# Patient Record
Sex: Female | Born: 1945 | Race: Black or African American | Hispanic: No | Marital: Single | Smoking: Never smoker
Health system: Southern US, Community
[De-identification: ages and names within clinical notes are randomized; demographics above are authoritative.]

## PROBLEM LIST (undated history)

## (undated) DIAGNOSIS — M1990 Unspecified osteoarthritis, unspecified site: Secondary | ICD-10-CM

## (undated) DIAGNOSIS — K5792 Diverticulitis of intestine, part unspecified, without perforation or abscess without bleeding: Secondary | ICD-10-CM

## (undated) DIAGNOSIS — I5189 Other ill-defined heart diseases: Secondary | ICD-10-CM

## (undated) DIAGNOSIS — D126 Benign neoplasm of colon, unspecified: Secondary | ICD-10-CM

## (undated) DIAGNOSIS — R0602 Shortness of breath: Secondary | ICD-10-CM

## (undated) DIAGNOSIS — E049 Nontoxic goiter, unspecified: Secondary | ICD-10-CM

## (undated) DIAGNOSIS — K2960 Other gastritis without bleeding: Secondary | ICD-10-CM

## (undated) DIAGNOSIS — R079 Chest pain, unspecified: Secondary | ICD-10-CM

## (undated) DIAGNOSIS — M5030 Other cervical disc degeneration, unspecified cervical region: Secondary | ICD-10-CM

## (undated) DIAGNOSIS — E669 Obesity, unspecified: Secondary | ICD-10-CM

## (undated) DIAGNOSIS — K529 Noninfective gastroenteritis and colitis, unspecified: Secondary | ICD-10-CM

## (undated) DIAGNOSIS — I10 Essential (primary) hypertension: Secondary | ICD-10-CM

## (undated) DIAGNOSIS — E785 Hyperlipidemia, unspecified: Secondary | ICD-10-CM

## (undated) DIAGNOSIS — R3129 Other microscopic hematuria: Secondary | ICD-10-CM

## (undated) DIAGNOSIS — J45909 Unspecified asthma, uncomplicated: Secondary | ICD-10-CM

## (undated) DIAGNOSIS — E119 Type 2 diabetes mellitus without complications: Secondary | ICD-10-CM

## (undated) DIAGNOSIS — R002 Palpitations: Secondary | ICD-10-CM

## (undated) DIAGNOSIS — D649 Anemia, unspecified: Secondary | ICD-10-CM

## (undated) DIAGNOSIS — N2581 Secondary hyperparathyroidism of renal origin: Secondary | ICD-10-CM

## (undated) DIAGNOSIS — E213 Hyperparathyroidism, unspecified: Secondary | ICD-10-CM

## (undated) DIAGNOSIS — E042 Nontoxic multinodular goiter: Secondary | ICD-10-CM

## (undated) DIAGNOSIS — N1831 Chronic kidney disease, stage 3a: Secondary | ICD-10-CM

## (undated) DIAGNOSIS — N281 Cyst of kidney, acquired: Secondary | ICD-10-CM

## (undated) DIAGNOSIS — K219 Gastro-esophageal reflux disease without esophagitis: Secondary | ICD-10-CM

## (undated) HISTORY — PX: APPENDECTOMY: SHX54

## (undated) HISTORY — PX: TUBAL LIGATION: SHX77

## (undated) HISTORY — PX: JOINT REPLACEMENT: SHX530

## (undated) HISTORY — PX: ANTERIOR CERVICAL DECOMP/DISCECTOMY FUSION: SHX1161

## (undated) HISTORY — PX: BILATERAL SALPINGOOPHORECTOMY: SHX1223

## (undated) HISTORY — PX: VAGINAL HYSTERECTOMY: SHX2639

## (undated) HISTORY — PX: SHOULDER ARTHROSCOPY W/ ROTATOR CUFF REPAIR: SHX2400

---

## 2014-08-30 ENCOUNTER — Ambulatory Visit: Payer: Self-pay

## 2014-08-30 LAB — URINALYSIS, COMPLETE
BILIRUBIN, UR: NEGATIVE
GLUCOSE, UR: NEGATIVE
Ketone: NEGATIVE
Nitrite: NEGATIVE
PH: 5.5 (ref 5.0–8.0)
Protein: NEGATIVE
Specific Gravity: 1.01 (ref 1.000–1.030)
Squamous Epithelial: NONE SEEN

## 2014-09-01 LAB — URINE CULTURE

## 2015-03-06 HISTORY — PX: TOTAL HIP ARTHROPLASTY: SHX124

## 2015-04-10 HISTORY — PX: COLONOSCOPY: SHX174

## 2017-01-12 HISTORY — PX: TOTAL HIP ARTHROPLASTY: SHX124

## 2018-10-24 ENCOUNTER — Ambulatory Visit
Admission: EM | Admit: 2018-10-24 | Discharge: 2018-10-24 | Disposition: A | Discharge status: Home / Self Care | Payer: Medicare HMO | Attending: Family Medicine | Admitting: Family Medicine

## 2018-10-24 ENCOUNTER — Encounter: Payer: Self-pay | Admitting: Emergency Medicine

## 2018-10-24 ENCOUNTER — Other Ambulatory Visit: Payer: Self-pay

## 2018-10-24 DIAGNOSIS — E785 Hyperlipidemia, unspecified: Secondary | ICD-10-CM | POA: Insufficient documentation

## 2018-10-24 DIAGNOSIS — R1110 Vomiting, unspecified: Secondary | ICD-10-CM | POA: Insufficient documentation

## 2018-10-24 DIAGNOSIS — M791 Myalgia, unspecified site: Secondary | ICD-10-CM | POA: Diagnosis not present

## 2018-10-24 DIAGNOSIS — M25511 Pain in right shoulder: Secondary | ICD-10-CM | POA: Diagnosis not present

## 2018-10-24 DIAGNOSIS — E119 Type 2 diabetes mellitus without complications: Secondary | ICD-10-CM | POA: Insufficient documentation

## 2018-10-24 DIAGNOSIS — E669 Obesity, unspecified: Secondary | ICD-10-CM | POA: Diagnosis not present

## 2018-10-24 DIAGNOSIS — Z79899 Other long term (current) drug therapy: Secondary | ICD-10-CM | POA: Diagnosis not present

## 2018-10-24 DIAGNOSIS — Z6832 Body mass index (BMI) 32.0-32.9, adult: Secondary | ICD-10-CM | POA: Insufficient documentation

## 2018-10-24 DIAGNOSIS — I493 Ventricular premature depolarization: Secondary | ICD-10-CM | POA: Insufficient documentation

## 2018-10-24 DIAGNOSIS — R9431 Abnormal electrocardiogram [ECG] [EKG]: Secondary | ICD-10-CM | POA: Diagnosis not present

## 2018-10-24 DIAGNOSIS — I10 Essential (primary) hypertension: Secondary | ICD-10-CM | POA: Diagnosis not present

## 2018-10-24 DIAGNOSIS — M25512 Pain in left shoulder: Secondary | ICD-10-CM | POA: Insufficient documentation

## 2018-10-24 DIAGNOSIS — Z885 Allergy status to narcotic agent status: Secondary | ICD-10-CM | POA: Diagnosis not present

## 2018-10-24 DIAGNOSIS — Z7984 Long term (current) use of oral hypoglycemic drugs: Secondary | ICD-10-CM | POA: Diagnosis not present

## 2018-10-24 DIAGNOSIS — R002 Palpitations: Secondary | ICD-10-CM | POA: Insufficient documentation

## 2018-10-24 HISTORY — DX: Hyperlipidemia, unspecified: E78.5

## 2018-10-24 HISTORY — DX: Diverticulitis of intestine, part unspecified, without perforation or abscess without bleeding: K57.92

## 2018-10-24 HISTORY — DX: Essential (primary) hypertension: I10

## 2018-10-24 HISTORY — DX: Hyperparathyroidism, unspecified: E21.3

## 2018-10-24 HISTORY — DX: Type 2 diabetes mellitus without complications: E11.9

## 2018-10-24 HISTORY — DX: Obesity, unspecified: E66.9

## 2018-10-24 HISTORY — DX: Anemia, unspecified: D64.9

## 2018-10-24 HISTORY — DX: Other microscopic hematuria: R31.29

## 2018-10-24 HISTORY — DX: Other gastritis without bleeding: K29.60

## 2018-10-24 HISTORY — DX: Nontoxic goiter, unspecified: E04.9

## 2018-10-24 LAB — COMPREHENSIVE METABOLIC PANEL
ALT: 14 U/L (ref 0–44)
AST: 21 U/L (ref 15–41)
Albumin: 4.1 g/dL (ref 3.5–5.0)
Alkaline Phosphatase: 73 U/L (ref 38–126)
Anion gap: 9 (ref 5–15)
BUN: 20 mg/dL (ref 8–23)
CO2: 25 mmol/L (ref 22–32)
CREATININE: 1.23 mg/dL — AB (ref 0.44–1.00)
Calcium: 9.9 mg/dL (ref 8.9–10.3)
Chloride: 105 mmol/L (ref 98–111)
GFR calc Af Amer: 51 mL/min — ABNORMAL LOW (ref >60)
GFR calc non Af Amer: 44 mL/min — ABNORMAL LOW (ref >60)
Glucose, Bld: 106 mg/dL — ABNORMAL HIGH (ref 70–99)
POTASSIUM: 4.2 mmol/L (ref 3.5–5.1)
Sodium: 139 mmol/L (ref 135–145)
Total Bilirubin: 0.3 mg/dL (ref 0.3–1.2)
Total Protein: 7.6 g/dL (ref 6.5–8.1)

## 2018-10-24 LAB — CBC WITH DIFFERENTIAL/PLATELET
Abs Immature Granulocytes: 0.01 10*3/uL (ref 0.00–0.07)
Basophils Absolute: 0.1 10*3/uL (ref 0.0–0.1)
Basophils Relative: 1 %
Eosinophils Absolute: 0.4 10*3/uL (ref 0.0–0.5)
Eosinophils Relative: 6 %
HCT: 31 % — ABNORMAL LOW (ref 36.0–46.0)
Hemoglobin: 10 g/dL — ABNORMAL LOW (ref 12.0–15.0)
Immature Granulocytes: 0 %
LYMPHS ABS: 2 10*3/uL (ref 0.7–4.0)
Lymphocytes Relative: 30 %
MCH: 26.8 pg (ref 26.0–34.0)
MCHC: 32.3 g/dL (ref 30.0–36.0)
MCV: 83.1 fL (ref 80.0–100.0)
MONOS PCT: 8 %
Monocytes Absolute: 0.5 10*3/uL (ref 0.1–1.0)
Neutro Abs: 3.8 10*3/uL (ref 1.7–7.7)
Neutrophils Relative %: 55 %
Platelets: 335 10*3/uL (ref 150–400)
RBC: 3.73 MIL/uL — ABNORMAL LOW (ref 3.87–5.11)
RDW: 13.8 % (ref 11.5–15.5)
WBC: 6.8 10*3/uL (ref 4.0–10.5)
nRBC: 0 % (ref 0.0–0.2)

## 2018-10-24 LAB — LIPASE, BLOOD: Lipase: 25 U/L (ref 11–51)

## 2018-10-24 NOTE — ED Provider Notes (Signed)
[facility]    CSN: [id number] Arrival date & time: 10/24/18  1029     History   Chief Complaint Chief Complaint  Patient presents with  . Emesis  . Palpitations  . Muscle Pain    HPI Joyce Mckinney is a 72 y.o. female.   72 yo female with a c/o vomiting and palpitations last night associated with left and right shoulder pains. States vomiting and shoulder pains have resolved, however still feels palpitations. Denies any chest pains, shortness of breath, fevers, chills, diarrhea, abdominal pains. Also states that about 2 weeks ago she had similar symptoms that resolved on their own.    The history is provided by the patient.  Emesis  Palpitations  Associated symptoms: vomiting   Muscle Pain     Past Medical History:  Diagnosis Date  . Anemia   . Diabetes mellitus without complication ([place])   . Diverticulitis   . Goiter   . Hyperlipidemia   . Hyperparathyroidism ([place])   . Hypertension   . Microscopic hematuria   . Obesity   . Reflux gastritis     There are no active problems to display for this patient.   Past Surgical History:  Procedure Laterality Date  . JOINT REPLACEMENT      OB History   None      Home Medications    Prior to Admission medications   Medication Sig Start Date End Date Taking? Authorizing Provider  famotidine (PEPCID) 10 MG tablet Take 10 mg by mouth 2 (two) times daily.   Yes [provider]  fluticasone (FLONASE) 50 MCG/ACT nasal spray SHAKE LQ AND U 2 SPRAYS IEN BID 10/18/18  Yes [provider]  glipiZIDE (GLUCOTROL XL) 2.5 MG 24 hr tablet  10/18/18  Yes [provider]  latanoprost (XALATAN) 0.005 % ophthalmic solution  10/03/18  Yes [provider]  metFORMIN (GLUCOPHAGE-XR) 750 MG 24 hr tablet  09/01/18  Yes [provider]  olmesartan-hydrochlorothiazide (BENICAR HCT) 40-25 MG tablet  08/05/18  Yes [provider]  simvastatin (ZOCOR) 40 MG tablet  08/31/18   Yes [provider]    Family History History reviewed. No pertinent family history.  Social History Social History   Tobacco Use  . Smoking status: Never Smoker  . Smokeless tobacco: Never Used  Substance Use Topics  . Alcohol use: Yes    Comment: socially  . Drug use: Never     Allergies   Morphine and related   Review of Systems Review of Systems  Cardiovascular: Positive for palpitations.  Gastrointestinal: Positive for vomiting.     Physical Exam Triage Vital Signs ED Triage Vitals  Enc Vitals Group     BP 10/24/18 1051 (!) 151/84     Pulse Rate 10/24/18 1051 81     Resp 10/24/18 1051 18     Temp 10/24/18 1051 98.1 F (36.7 C)     Temp Source 10/24/18 1051 Oral     SpO2 10/24/18 1051 100 %     Weight 10/24/18 1044 180 lb (81.6 kg)     Height 10/24/18 1044 5\' 2"  (1.575 m)     Head Circumference --      Peak Flow --      Pain Score 10/24/18 1044 0     Pain Loc --      Pain Edu? --      Excl. in [place]? --    No data found.  Updated Vital Signs BP (!) 151/84 (  BP Location: Left Arm)   Pulse 81   Temp 98.1 F (36.7 C) (Oral)   Resp 18   Ht 5\' 2"  (1.575 m)   Wt 81.6 kg   SpO2 100%   BMI 32.92 kg/m   Visual Acuity Right Eye Distance:   Left Eye Distance:   Bilateral Distance:    Right Eye Near:   Left Eye Near:    Bilateral Near:     Physical Exam  Constitutional: She appears well-developed and well-nourished. No distress.  HENT:  Head: Normocephalic and atraumatic.  Mouth/Throat: Uvula is midline, oropharynx is clear and moist and mucous membranes are normal. No oropharyngeal exudate.  Neck: Normal range of motion. Neck supple. No thyromegaly present.  Cardiovascular: Normal rate, regular rhythm, normal heart sounds and intact distal pulses.  Pulmonary/Chest: Effort normal and breath sounds normal. No stridor. No respiratory distress. She has no wheezes. She has no rales. She exhibits tenderness (upper chest bilaterally; mild).    Lymphadenopathy:    She has no cervical adenopathy.  Skin: She is not diaphoretic.  Nursing note and vitals reviewed.    UC Treatments / Results  Labs (all labs ordered are listed, but only abnormal results are displayed) Labs Reviewed  CBC WITH DIFFERENTIAL/PLATELET - Abnormal; Notable for the following components:      Result Value   RBC 3.73 (*)    Hemoglobin 10.0 (*)    HCT 31.0 (*)    All other components within normal limits  COMPREHENSIVE METABOLIC PANEL - Abnormal; Notable for the following components:   Glucose, Bld 106 (*)    Creatinine, Ser 1.23 (*)    GFR calc non Af Amer 44 (*)    GFR calc Af Amer 51 (*)    All other components within normal limits  LIPASE, BLOOD    EKG None  Radiology No results found.  Procedures ED EKG Date/Time: 10/24/2018 1:29 PM Performed by: Norval Gable, MD Authorized by: Norval Gable, MD   ECG reviewed by ED Physician in the absence of a cardiologist: yes   Previous ECG:    Previous ECG:  Unavailable Interpretation:    Interpretation: abnormal   Rate:    ECG rate:  81   ECG rate assessment: normal   Rhythm:    Rhythm: sinus rhythm   Ectopy:    Ectopy: PVCs     PVCs:  Infrequent QRS:    QRS axis:  Normal   QRS intervals:  Normal Conduction:    Conduction: normal   ST segments:    ST segments:  Normal T waves:    T waves: normal     (including critical care time)  Medications Ordered in UC Medications - No data to display  Initial Impression / Assessment and Plan / UC Course  I have reviewed the triage vital signs and the nursing notes.  Pertinent labs & imaging results that were available during my care of the patient were reviewed by me and considered in my medical decision making (see chart for details).      Final Clinical Impressions(s) / UC Diagnoses   Final diagnoses:  Palpitations  Premature ventricular contractions (PVCs) (VPCs)     Discharge Instructions     Recommend follow up with  Primary Care provider for referral to cardiology    ED Prescriptions    None     1. Labs/ekg results and diagnosis reviewed with patient 2. Follow up with PCP as above 3. Go to ED if symptoms recur or  worsen 4. Follow-up prn    Controlled Substance Prescriptions [place] Controlled Substance Registry consulted? Not Applicable   Norval Gable, MD 10/24/18 [phone]

## 2018-10-24 NOTE — ED Triage Notes (Signed)
Patient c/o vomiting x 2 last night and heart palpitations that started last night. She also reported left and right shoulder pain last night but that has subsided.

## 2018-10-24 NOTE — Discharge Instructions (Signed)
Recommend follow up with Primary Care provider for referral to cardiology

## 2019-12-07 ENCOUNTER — Other Ambulatory Visit: Payer: Self-pay | Admitting: Acute Care

## 2019-12-07 DIAGNOSIS — M545 Low back pain, unspecified: Secondary | ICD-10-CM

## 2019-12-07 DIAGNOSIS — R32 Unspecified urinary incontinence: Secondary | ICD-10-CM

## 2019-12-08 ENCOUNTER — Ambulatory Visit
Admission: RE | Admit: 2019-12-08 | Discharge: 2019-12-08 | Disposition: A | Discharge status: Home / Self Care | Payer: Medicare HMO | Source: Ambulatory Visit | Attending: Acute Care | Admitting: Acute Care

## 2019-12-08 ENCOUNTER — Other Ambulatory Visit: Payer: Self-pay

## 2019-12-08 DIAGNOSIS — R32 Unspecified urinary incontinence: Secondary | ICD-10-CM | POA: Insufficient documentation

## 2019-12-08 DIAGNOSIS — M545 Low back pain, unspecified: Secondary | ICD-10-CM

## 2020-01-04 HISTORY — PX: LUMBAR FUSION: SHX111

## 2020-01-17 ENCOUNTER — Other Ambulatory Visit: Payer: Self-pay | Admitting: Student

## 2020-01-17 DIAGNOSIS — M79604 Pain in right leg: Secondary | ICD-10-CM

## 2020-01-17 DIAGNOSIS — Z981 Arthrodesis status: Secondary | ICD-10-CM

## 2020-01-24 ENCOUNTER — Ambulatory Visit
Admission: RE | Admit: 2020-01-24 | Discharge: 2020-01-24 | Disposition: A | Discharge status: Home / Self Care | Payer: Medicare HMO | Source: Ambulatory Visit | Attending: Student | Admitting: Student

## 2020-01-24 ENCOUNTER — Other Ambulatory Visit: Payer: Self-pay

## 2020-01-24 DIAGNOSIS — M79604 Pain in right leg: Secondary | ICD-10-CM | POA: Diagnosis present

## 2020-01-24 DIAGNOSIS — Z981 Arthrodesis status: Secondary | ICD-10-CM | POA: Insufficient documentation

## 2021-01-05 ENCOUNTER — Other Ambulatory Visit: Payer: Self-pay

## 2021-01-05 ENCOUNTER — Encounter: Payer: Self-pay | Admitting: Emergency Medicine

## 2021-01-05 ENCOUNTER — Emergency Department
Admission: EM | Admit: 2021-01-05 | Discharge: 2021-01-05 | Disposition: A | Discharge status: Home / Self Care | Payer: Medicare HMO | Attending: Emergency Medicine | Admitting: Emergency Medicine

## 2021-01-05 DIAGNOSIS — H5713 Ocular pain, bilateral: Secondary | ICD-10-CM | POA: Insufficient documentation

## 2021-01-05 DIAGNOSIS — I10 Essential (primary) hypertension: Secondary | ICD-10-CM | POA: Diagnosis not present

## 2021-01-05 DIAGNOSIS — Z7984 Long term (current) use of oral hypoglycemic drugs: Secondary | ICD-10-CM | POA: Insufficient documentation

## 2021-01-05 DIAGNOSIS — E119 Type 2 diabetes mellitus without complications: Secondary | ICD-10-CM | POA: Diagnosis not present

## 2021-01-05 DIAGNOSIS — Z96659 Presence of unspecified artificial knee joint: Secondary | ICD-10-CM | POA: Diagnosis not present

## 2021-01-05 DIAGNOSIS — H209 Unspecified iridocyclitis: Secondary | ICD-10-CM | POA: Diagnosis not present

## 2021-01-05 DIAGNOSIS — Z79899 Other long term (current) drug therapy: Secondary | ICD-10-CM | POA: Diagnosis not present

## 2021-01-05 DIAGNOSIS — H5710 Ocular pain, unspecified eye: Secondary | ICD-10-CM

## 2021-01-05 MED ORDER — PREDNISOLONE ACETATE 1 % OP SUSP: 1.0000 [drp] | Freq: Four times a day (QID) | OPHTHALMIC | 0 refills | Status: DC

## 2021-01-05 MED ORDER — TETRACAINE HCL 0.5 % OP SOLN
2.0000 [drp] | Freq: Once | OPHTHALMIC | Status: AC
  Administered 2021-01-05: 2 [drp] via OPHTHALMIC
  Filled 2021-01-05: qty 4

## 2021-01-05 MED ORDER — FLUORESCEIN SODIUM 1 MG OP STRP
1.0000 | ORAL_STRIP | Freq: Once | OPHTHALMIC | Status: AC
  Administered 2021-01-05: 1 via OPHTHALMIC
  Filled 2021-01-05: qty 1

## 2021-01-05 MED ORDER — TOBRAMYCIN 0.3 % OP SOLN: 2.0000 [drp] | OPHTHALMIC | 0 refills | Status: DC

## 2021-01-05 NOTE — ED Notes (Signed)
Pt with c/o bilateral eye pain beginning last night, pt states there was no cause that she remembers. Pt with redness to bilateral sclera, no drainage noted.

## 2021-01-05 NOTE — ED Provider Notes (Signed)
[facility] Emergency Department Provider Note  ____________________________________________   Event Date/Time   First MD Initiated Contact with Patient 01/05/21 1236     (approximate)  I have reviewed the triage vital signs and the nursing notes.   HISTORY  Chief Complaint Eye Pain    HPI Joyce Mckinney is a 75 y.o. female presents emergency department complaining of bilateral eye pain that started last night.  No known injury.  Patient has history of glaucoma but is been taking her drops every day as instructed.  States she did run out of them for a month but restarted her medication over 2 weeks ago.  No change in her vision.  States the light hurts her eyes.  No vomiting.  No headache.    Past Medical History:  Diagnosis Date  . Anemia   . Diabetes mellitus without complication ([place])   . Diverticulitis   . Goiter   . Hyperlipidemia   . Hyperparathyroidism ([place])   . Hypertension   . Microscopic hematuria   . Obesity   . Reflux gastritis     There are no problems to display for this patient.   Past Surgical History:  Procedure Laterality Date  . JOINT REPLACEMENT      Prior to Admission medications   Medication Sig Start Date End Date Taking? Authorizing Provider  prednisoLONE acetate (PRED FORTE) 1 % ophthalmic suspension Place 1 drop into both eyes 4 (four) times daily. 01/05/21  Yes Banyan Goodchild, Linden Dolin, PA-C  tobramycin (TOBREX) 0.3 % ophthalmic solution Place 2 drops into both eyes every 4 (four) hours. 01/05/21  Yes Janoah Menna, Linden Dolin, PA-C  famotidine (PEPCID) 10 MG tablet Take 10 mg by mouth 2 (two) times daily.    [provider]  fluticasone (FLONASE) 50 MCG/ACT nasal spray SHAKE LQ AND U 2 SPRAYS IEN BID 10/18/18   [provider]  glipiZIDE (GLUCOTROL XL) 2.5 MG 24 hr tablet  10/18/18   [provider]  latanoprost (XALATAN) 0.005 % ophthalmic solution  10/03/18   [provider]  metFORMIN  (GLUCOPHAGE-XR) 750 MG 24 hr tablet  09/01/18   [provider]  olmesartan-hydrochlorothiazide (BENICAR HCT) 40-25 MG tablet  08/05/18   [provider]  simvastatin (ZOCOR) 40 MG tablet  08/31/18   [provider]    Allergies Morphine and related  History reviewed. No pertinent family history.  Social History Social History   Tobacco Use  . Smoking status: Never Smoker  . Smokeless tobacco: Never Used  Vaping Use  . Vaping Use: Never used  Substance Use Topics  . Alcohol use: Yes    Comment: socially  . Drug use: Never    Review of Systems  Constitutional: No fever/chills Eyes: No visual changes.  Positive eye pain bilaterally ENT: No sore throat. Respiratory: Denies cough Cardiovascular: Denies chest pain Gastrointestinal: Denies abdominal pain Genitourinary: Negative for dysuria. Musculoskeletal: Negative for back pain. Skin: Negative for rash. Psychiatric: no mood changes,     ____________________________________________   PHYSICAL EXAM:  VITAL SIGNS: ED Triage Vitals  Enc Vitals Group     BP 01/05/21 1120 (!) 168/77     Pulse Rate 01/05/21 1120 92     Resp 01/05/21 1120 20     Temp 01/05/21 1120 98.6 F (37 C)     Temp Source 01/05/21 1120 Oral     SpO2 01/05/21 1120 96 %     Weight 01/05/21 1121 155 lb (70.3 kg)  Height 01/05/21 1121 5' 2.5" (1.588 m)     Head Circumference --      Peak Flow --      Pain Score 01/05/21 1121 0     Pain Loc --      Pain Edu? --      Excl. in [place]? --     Constitutional: Alert and oriented. Well appearing and in no acute distress. Eyes: Conjunctivae are slightly injected bilaterally, Tono-Pen used for pressures, the right averages at 14 and the left averages at 15 no foreign body noted, PERRL  head: Atraumatic. Nose: No congestion/rhinnorhea. Mouth/Throat: Mucous membranes are moist.  Neck:  supple no lymphadenopathy noted Cardiovascular: Normal rate, regular rhythm.  Respiratory:  Normal respiratory effort.  No retractions, GU: deferred Musculoskeletal: FROM all extremities, warm and well perfused Neurologic:  Normal speech and language.  Skin:  Skin is warm, dry and intact. No rash noted. Psychiatric: Mood and affect are normal. Speech and behavior are normal.  ____________________________________________   LABS (all labs ordered are listed, but only abnormal results are displayed)  Labs Reviewed - No data to display ____________________________________________   ____________________________________________  RADIOLOGY    ____________________________________________   PROCEDURES  Procedure(s) performed: No  Procedures    ____________________________________________   INITIAL IMPRESSION / ASSESSMENT AND PLAN / ED COURSE  Pertinent labs & imaging results that were available during my care of the patient were reviewed by me and considered in my medical decision making (see chart for details).   Patient 75 year old female presents with history of glaucoma and complaining of bilateral eye pain and sensitivity to light.  See HPI.  Physical exam shows the patient to appear stable.  Both eyes are injected.  Tono-Pen used for pressures which average to 14 on the right and 15 on the left.  Patient states this is her normal glaucoma reading.  She is to follow-up with her eye doctor tomorrow.  I feel this may be more of a iritis.  She was started on tobramycin ophthalmic drops and prednisolone ophthalmic drops.  Is to return emergency department worsening.  Is discharged in stable condition care of her daughter     Joyce Mckinney was evaluated in Emergency Department on 01/05/2021 for the symptoms described in the history of present illness. She was evaluated in the context of the global COVID-19 pandemic, which necessitated consideration that the patient might be at risk for infection with the SARS-CoV-2 virus that causes COVID-19. Institutional  protocols and algorithms that pertain to the evaluation of patients at risk for COVID-19 are in a state of rapid change based on information released by regulatory bodies including the CDC and federal and state organizations. These policies and algorithms were followed during the patient's care in the ED.    As part of my medical decision making, I reviewed the following data within the [place] notes reviewed and incorporated, Old chart reviewed, Notes from prior ED visits and [place] Controlled Substance Database  ____________________________________________   FINAL CLINICAL IMPRESSION(S) / ED DIAGNOSES  Final diagnoses:  Pain in eye, unspecified laterality  Iritis of both eyes      NEW MEDICATIONS STARTED DURING THIS VISIT:  New Prescriptions   PREDNISOLONE ACETATE (PRED FORTE) 1 % OPHTHALMIC SUSPENSION    Place 1 drop into both eyes 4 (four) times daily.   TOBRAMYCIN (TOBREX) 0.3 % OPHTHALMIC SOLUTION    Place 2 drops into both eyes every 4 (four) hours.     Note:  This document was prepared using Dragon voice recognition software and may include unintentional dictation errors.    Versie Starks, PA-C 01/05/21 1400    Delman Kitten, MD 01/05/21 [phone]

## 2021-01-05 NOTE — ED Triage Notes (Signed)
Pt to ED via POV with c/o R eye pain, pt states woke up last night with 2 red eyes, states woke up this morning with pain to R eye, also c/o light sensitivity at this time. Pt states pain relief with warm compresses.

## 2021-01-05 NOTE — ED Notes (Signed)
Pt acknowledged receipt of DC instructions and verbalized understanding of importance of F/U and new rx's. Signature pad not working.

## 2021-01-05 NOTE — Discharge Instructions (Addendum)
Follow-up with your regular eye doctor.  Please call for an appointment Return to the emergency department for worsening You may take over-the-counter pain medication as needed

## 2021-06-13 ENCOUNTER — Emergency Department: Payer: Medicare HMO

## 2021-06-13 ENCOUNTER — Emergency Department
Admission: EM | Admit: 2021-06-13 | Discharge: 2021-06-13 | Disposition: A | Discharge status: Home / Self Care | Payer: Medicare HMO | Attending: Emergency Medicine | Admitting: Emergency Medicine

## 2021-06-13 ENCOUNTER — Encounter: Payer: Self-pay | Admitting: Emergency Medicine

## 2021-06-13 ENCOUNTER — Other Ambulatory Visit: Payer: Self-pay

## 2021-06-13 DIAGNOSIS — M542 Cervicalgia: Secondary | ICD-10-CM | POA: Diagnosis not present

## 2021-06-13 DIAGNOSIS — S20219A Contusion of unspecified front wall of thorax, initial encounter: Secondary | ICD-10-CM

## 2021-06-13 DIAGNOSIS — S8002XA Contusion of left knee, initial encounter: Secondary | ICD-10-CM | POA: Insufficient documentation

## 2021-06-13 DIAGNOSIS — S20212A Contusion of left front wall of thorax, initial encounter: Secondary | ICD-10-CM | POA: Insufficient documentation

## 2021-06-13 DIAGNOSIS — I10 Essential (primary) hypertension: Secondary | ICD-10-CM | POA: Insufficient documentation

## 2021-06-13 DIAGNOSIS — Z9660 Presence of unspecified orthopedic joint implant: Secondary | ICD-10-CM | POA: Insufficient documentation

## 2021-06-13 DIAGNOSIS — S301XXA Contusion of abdominal wall, initial encounter: Secondary | ICD-10-CM | POA: Insufficient documentation

## 2021-06-13 DIAGNOSIS — Z7984 Long term (current) use of oral hypoglycemic drugs: Secondary | ICD-10-CM | POA: Insufficient documentation

## 2021-06-13 DIAGNOSIS — R109 Unspecified abdominal pain: Secondary | ICD-10-CM

## 2021-06-13 DIAGNOSIS — Y92410 Unspecified street and highway as the place of occurrence of the external cause: Secondary | ICD-10-CM | POA: Insufficient documentation

## 2021-06-13 DIAGNOSIS — M25512 Pain in left shoulder: Secondary | ICD-10-CM | POA: Diagnosis not present

## 2021-06-13 DIAGNOSIS — S299XXA Unspecified injury of thorax, initial encounter: Secondary | ICD-10-CM

## 2021-06-13 DIAGNOSIS — E119 Type 2 diabetes mellitus without complications: Secondary | ICD-10-CM | POA: Diagnosis not present

## 2021-06-13 DIAGNOSIS — S3991XA Unspecified injury of abdomen, initial encounter: Secondary | ICD-10-CM | POA: Diagnosis present

## 2021-06-13 DIAGNOSIS — Z79899 Other long term (current) drug therapy: Secondary | ICD-10-CM | POA: Diagnosis not present

## 2021-06-13 LAB — COMPREHENSIVE METABOLIC PANEL
ALT: 14 U/L (ref 0–44)
AST: 22 U/L (ref 15–41)
Albumin: 4.2 g/dL (ref 3.5–5.0)
Alkaline Phosphatase: 80 U/L (ref 38–126)
Anion gap: 10 (ref 5–15)
BUN: 19 mg/dL (ref 8–23)
CO2: 25 mmol/L (ref 22–32)
Calcium: 10.5 mg/dL — ABNORMAL HIGH (ref 8.9–10.3)
Chloride: 107 mmol/L (ref 98–111)
Creatinine, Ser: 1.13 mg/dL — ABNORMAL HIGH (ref 0.44–1.00)
GFR, Estimated: 52 mL/min — ABNORMAL LOW (ref >60)
Glucose, Bld: 102 mg/dL — ABNORMAL HIGH (ref 70–99)
Potassium: 4 mmol/L (ref 3.5–5.1)
Sodium: 142 mmol/L (ref 135–145)
Total Bilirubin: 0.9 mg/dL (ref 0.3–1.2)
Total Protein: 6.9 g/dL (ref 6.5–8.1)

## 2021-06-13 LAB — TROPONIN I (HIGH SENSITIVITY): Troponin I (High Sensitivity): 9 ng/L (ref <18)

## 2021-06-13 LAB — CBC WITH DIFFERENTIAL/PLATELET
Abs Immature Granulocytes: 0.11 10*3/uL — ABNORMAL HIGH (ref 0.00–0.07)
Basophils Absolute: 0.1 10*3/uL (ref 0.0–0.1)
Basophils Relative: 1 %
Eosinophils Absolute: 0.3 10*3/uL (ref 0.0–0.5)
Eosinophils Relative: 2 %
HCT: 32.3 % — ABNORMAL LOW (ref 36.0–46.0)
Hemoglobin: 10.5 g/dL — ABNORMAL LOW (ref 12.0–15.0)
Immature Granulocytes: 1 %
Lymphocytes Relative: 10 %
Lymphs Abs: 1.4 10*3/uL (ref 0.7–4.0)
MCH: 27 pg (ref 26.0–34.0)
MCHC: 32.5 g/dL (ref 30.0–36.0)
MCV: 83 fL (ref 80.0–100.0)
Monocytes Absolute: 0.9 10*3/uL (ref 0.1–1.0)
Monocytes Relative: 7 %
Neutro Abs: 11.1 10*3/uL — ABNORMAL HIGH (ref 1.7–7.7)
Neutrophils Relative %: 79 %
Platelets: 408 10*3/uL — ABNORMAL HIGH (ref 150–400)
RBC: 3.89 MIL/uL (ref 3.87–5.11)
RDW: 14 % (ref 11.5–15.5)
WBC: 13.9 10*3/uL — ABNORMAL HIGH (ref 4.0–10.5)
nRBC: 0 % (ref 0.0–0.2)

## 2021-06-13 MED ORDER — MELOXICAM 7.5 MG PO TABS: 7.5000 mg | ORAL_TABLET | Freq: Every day | ORAL | 0 refills | Status: DC

## 2021-06-13 MED ORDER — METHOCARBAMOL 500 MG PO TABS: 500.0000 mg | ORAL_TABLET | Freq: Four times a day (QID) | ORAL | 0 refills | Status: DC

## 2021-06-13 MED ORDER — IOHEXOL 300 MG/ML  SOLN
75.0000 mL | Freq: Once | INTRAMUSCULAR | Status: AC | PRN
  Administered 2021-06-13: 75 mL via INTRAVENOUS
  Filled 2021-06-13: qty 75
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
## 2021-06-14 ENCOUNTER — Telehealth: Payer: Self-pay | Admitting: Emergency Medicine

## 2021-06-14 MED ORDER — MELOXICAM 7.5 MG PO TABS: 7.5000 mg | ORAL_TABLET | Freq: Every day | ORAL | 0 refills | Status: AC

## 2021-06-14 MED ORDER — METHOCARBAMOL 500 MG PO TABS: 500.0000 mg | ORAL_TABLET | Freq: Three times a day (TID) | ORAL | 0 refills | Status: AC

## 2021-06-14 NOTE — Telephone Encounter (Signed)
Patient requesting e-scripts, sent to her pharmacy. See Dr. Malachi Bonds note for provider notes.

## 2021-09-25 ENCOUNTER — Other Ambulatory Visit: Payer: Self-pay | Admitting: Family Medicine

## 2021-09-25 DIAGNOSIS — Z1231 Encounter for screening mammogram for malignant neoplasm of breast: Secondary | ICD-10-CM

## 2021-10-21 ENCOUNTER — Emergency Department
Admission: EM | Admit: 2021-10-21 | Discharge: 2021-10-21 | Disposition: A | Discharge status: Home / Self Care | Payer: Medicare HMO | Attending: Student in an Organized Health Care Education/Training Program | Admitting: Student in an Organized Health Care Education/Training Program

## 2021-10-21 ENCOUNTER — Other Ambulatory Visit: Payer: Self-pay

## 2021-10-21 ENCOUNTER — Emergency Department: Payer: Medicare HMO

## 2021-10-21 ENCOUNTER — Encounter: Payer: Self-pay | Admitting: Emergency Medicine

## 2021-10-21 DIAGNOSIS — E119 Type 2 diabetes mellitus without complications: Secondary | ICD-10-CM | POA: Insufficient documentation

## 2021-10-21 DIAGNOSIS — I10 Essential (primary) hypertension: Secondary | ICD-10-CM | POA: Diagnosis not present

## 2021-10-21 DIAGNOSIS — Z7984 Long term (current) use of oral hypoglycemic drugs: Secondary | ICD-10-CM | POA: Insufficient documentation

## 2021-10-21 DIAGNOSIS — Z79899 Other long term (current) drug therapy: Secondary | ICD-10-CM | POA: Diagnosis not present

## 2021-10-21 LAB — CBC WITH DIFFERENTIAL/PLATELET
Abs Immature Granulocytes: 0.04 10*3/uL (ref 0.00–0.07)
Basophils Absolute: 0.1 10*3/uL (ref 0.0–0.1)
Basophils Relative: 1 %
Eosinophils Absolute: 0.7 10*3/uL — ABNORMAL HIGH (ref 0.0–0.5)
Eosinophils Relative: 7 %
HCT: 30.6 % — ABNORMAL LOW (ref 36.0–46.0)
Hemoglobin: 9.9 g/dL — ABNORMAL LOW (ref 12.0–15.0)
Immature Granulocytes: 0 %
Lymphocytes Relative: 29 %
Lymphs Abs: 3.1 10*3/uL (ref 0.7–4.0)
MCH: 27.6 pg (ref 26.0–34.0)
MCHC: 32.4 g/dL (ref 30.0–36.0)
MCV: 85.2 fL (ref 80.0–100.0)
Monocytes Absolute: 0.8 10*3/uL (ref 0.1–1.0)
Monocytes Relative: 8 %
Neutro Abs: 6.1 10*3/uL (ref 1.7–7.7)
Neutrophils Relative %: 55 %
Platelets: 373 10*3/uL (ref 150–400)
RBC: 3.59 MIL/uL — ABNORMAL LOW (ref 3.87–5.11)
RDW: 13.9 % (ref 11.5–15.5)
WBC: 10.9 10*3/uL — ABNORMAL HIGH (ref 4.0–10.5)
nRBC: 0 % (ref 0.0–0.2)

## 2021-10-21 LAB — COMPREHENSIVE METABOLIC PANEL
ALT: 13 U/L (ref 0–44)
AST: 20 U/L (ref 15–41)
Albumin: 3.8 g/dL (ref 3.5–5.0)
Alkaline Phosphatase: 73 U/L (ref 38–126)
Anion gap: 7 (ref 5–15)
BUN: 19 mg/dL (ref 8–23)
CO2: 24 mmol/L (ref 22–32)
Calcium: 9.7 mg/dL (ref 8.9–10.3)
Chloride: 104 mmol/L (ref 98–111)
Creatinine, Ser: 1.05 mg/dL — ABNORMAL HIGH (ref 0.44–1.00)
GFR, Estimated: 56 mL/min — ABNORMAL LOW (ref >60)
Glucose, Bld: 158 mg/dL — ABNORMAL HIGH (ref 70–99)
Potassium: 3.9 mmol/L (ref 3.5–5.1)
Sodium: 135 mmol/L (ref 135–145)
Total Bilirubin: 0.4 mg/dL (ref 0.3–1.2)
Total Protein: 6.8 g/dL (ref 6.5–8.1)

## 2021-10-21 LAB — URINALYSIS, ROUTINE W REFLEX MICROSCOPIC
Bilirubin Urine: NEGATIVE
Glucose, UA: NEGATIVE mg/dL
Hgb urine dipstick: NEGATIVE
Ketones, ur: NEGATIVE mg/dL
Leukocytes,Ua: NEGATIVE
Nitrite: NEGATIVE
Protein, ur: NEGATIVE mg/dL
Specific Gravity, Urine: <1.005 — ABNORMAL LOW (ref 1.005–1.030)
pH: 6.5 (ref 5.0–8.0)

## 2021-10-21 LAB — T4, FREE: Free T4: 0.91 ng/dL (ref 0.61–1.12)

## 2021-10-21 LAB — TROPONIN I (HIGH SENSITIVITY)
Troponin I (High Sensitivity): 8 ng/L (ref <18)
Troponin I (High Sensitivity): 8 ng/L (ref <18)

## 2021-10-21 LAB — TSH: TSH: 1.969 u[IU]/mL (ref 0.350–4.500)

## 2021-10-21 MED ORDER — AMLODIPINE BESYLATE 5 MG PO TABS: 5.0000 mg | ORAL_TABLET | Freq: Every day | ORAL | 0 refills | Status: DC

## 2021-10-21 MED ORDER — OLMESARTAN MEDOXOMIL-HCTZ 40-25 MG PO TABS: 1.0000 | ORAL_TABLET | Freq: Every day | ORAL | Status: DC

## 2021-10-21 MED ORDER — HYDROCHLOROTHIAZIDE 25 MG PO TABS: 25.0000 mg | ORAL_TABLET | Freq: Every day | ORAL | Status: DC

## 2021-10-21 MED ORDER — IRBESARTAN 150 MG PO TABS
300.0000 mg | ORAL_TABLET | Freq: Every day | ORAL | Status: DC
  Filled 2021-10-21: qty 2

## 2021-10-21 NOTE — ED Triage Notes (Addendum)
Patient ambulatory to triage with steady gait, without difficulty or distress noted; pt reports here for HTN; st she got up to go to BR and it was 170/110; c/o "tingling in feet" and "heart beating faster and harder"

## 2021-10-21 NOTE — ED Provider Notes (Signed)
[facility] Emergency Department Provider Note    Event Date/Time   First MD Initiated Contact with Patient 10/21/21 0750     (approximate)  I have reviewed the triage vital signs and the nursing notes.   HISTORY  Chief Complaint Hypertension    HPI Joyce Mckinney is a 75 y.o. female below listed past medical history including hyper tension as well as hyperparathyroidism presents to the ER for evaluation of concern for elevated blood pressure.  Has had some intermittent pain and achiness in her left shoulder and states that she also had some tingling in her feet that started early this morning around 1:00.  No blurry vision.  No weakness.  No word finding or speaking difficulties.  No diaphoresis or shortness of breath.  Has noticed that her blood pressure has been high for the past several weeks though she is been on the same medication.  No recent stressors.  States that she feels fine denies any pain or discomfort at this time.  Past Medical History:  Diagnosis Date   Anemia    Diabetes mellitus without complication ([place])    Diverticulitis    Goiter    Hyperlipidemia    Hyperparathyroidism ([place])    Hypertension    Microscopic hematuria    Obesity    Reflux gastritis    No family history on file. Past Surgical History:  Procedure Laterality Date   JOINT REPLACEMENT     There are no problems to display for this patient.     Prior to Admission medications   Medication Sig Start Date End Date Taking? Authorizing Provider  amLODipine (NORVASC) 5 MG tablet Take 1 tablet (5 mg total) by mouth daily. 10/21/21 10/21/22 Yes Merlyn Lot, MD  famotidine (PEPCID) 10 MG tablet Take 10 mg by mouth 2 (two) times daily.    [provider]  fluticasone (FLONASE) 50 MCG/ACT nasal spray SHAKE LQ AND U 2 SPRAYS IEN BID 10/18/18   [provider]  glipiZIDE (GLUCOTROL XL) 2.5 MG 24 hr tablet  10/18/18   [provider]   latanoprost (XALATAN) 0.005 % ophthalmic solution  10/03/18   [provider]  metFORMIN (GLUCOPHAGE-XR) 750 MG 24 hr tablet  09/01/18   [provider]  olmesartan-hydrochlorothiazide (BENICAR HCT) 40-25 MG tablet  08/05/18   [provider]  prednisoLONE acetate (PRED FORTE) 1 % ophthalmic suspension Place 1 drop into both eyes 4 (four) times daily. 01/05/21   Caryn Section Linden Dolin, PA-C  simvastatin (ZOCOR) 40 MG tablet  08/31/18   [provider]  tobramycin (TOBREX) 0.3 % ophthalmic solution Place 2 drops into both eyes every 4 (four) hours. 01/05/21   Caryn Section Linden Dolin, PA-C    Allergies Morphine and related    Social History Social History   Tobacco Use   Smoking status: Never   Smokeless tobacco: Never  Vaping Use   Vaping Use: Never used  Substance Use Topics   Alcohol use: Yes    Comment: socially   Drug use: Never    Review of Systems Patient denies headaches, rhinorrhea, blurry vision, numbness, shortness of breath, chest pain, edema, cough, abdominal pain, nausea, vomiting, diarrhea, dysuria, fevers, rashes or hallucinations unless otherwise stated above in HPI. ____________________________________________   PHYSICAL EXAM:  VITAL SIGNS: Vitals:   10/21/21 0454 10/21/21 0806  BP: (!) 160/83 (!) 160/101  Pulse: 90 82  Resp: 18 17  Temp: 97.8 F (36.6 C) 98.1 F (36.7 C)  SpO2: 98%  100%    Constitutional: Alert and oriented.  Eyes: Conjunctivae are normal.  Head: Atraumatic. Nose: No congestion/rhinnorhea. Mouth/Throat: Mucous membranes are moist.   Neck: No stridor. Painless ROM.  Cardiovascular: Normal rate, regular rhythm. Grossly normal heart sounds.  Good peripheral circulation. Respiratory: Normal respiratory effort.  No retractions. Lungs CTAB. Gastrointestinal: Soft and nontender. No distention. No abdominal bruits. No CVA tenderness. Genitourinary:  Musculoskeletal: No lower extremity tenderness nor edema.  No joint  effusions. Neurologic:  CN- intact.  No facial droop, Normal FNF.  Normal heel to shin.  Sensation intact bilaterally. Normal speech and language. No gross focal neurologic deficits are appreciated. No gait instability.  Skin:  Skin is warm, dry and intact. No rash noted. Psychiatric: Mood and affect are normal. Speech and behavior are normal.  ____________________________________________   LABS (all labs ordered are listed, but only abnormal results are displayed)  Results for orders placed or performed during the hospital encounter of 10/21/21 (from the past 24 hour(s))  CBC with Differential     Status: Abnormal   Collection Time: 10/21/21  4:12 AM  Result Value Ref Range   WBC 10.9 (H) 4.0 - 10.5 K/uL   RBC 3.59 (L) 3.87 - 5.11 MIL/uL   Hemoglobin 9.9 (L) 12.0 - 15.0 g/dL   HCT 30.6 (L) 36.0 - 46.0 %   MCV 85.2 80.0 - 100.0 fL   MCH 27.6 26.0 - 34.0 pg   MCHC 32.4 30.0 - 36.0 g/dL   RDW 13.9 11.5 - 15.5 %   Platelets 373 150 - 400 K/uL   nRBC 0.0 0.0 - 0.2 %   Neutrophils Relative % 55 %   Neutro Abs 6.1 1.7 - 7.7 K/uL   Lymphocytes Relative 29 %   Lymphs Abs 3.1 0.7 - 4.0 K/uL   Monocytes Relative 8 %   Monocytes Absolute 0.8 0.1 - 1.0 K/uL   Eosinophils Relative 7 %   Eosinophils Absolute 0.7 (H) 0.0 - 0.5 K/uL   Basophils Relative 1 %   Basophils Absolute 0.1 0.0 - 0.1 K/uL   Immature Granulocytes 0 %   Abs Immature Granulocytes 0.04 0.00 - 0.07 K/uL  Comprehensive metabolic panel     Status: Abnormal   Collection Time: 10/21/21  4:12 AM  Result Value Ref Range   Sodium 135 135 - 145 mmol/L   Potassium 3.9 3.5 - 5.1 mmol/L   Chloride 104 98 - 111 mmol/L   CO2 24 22 - 32 mmol/L   Glucose, Bld 158 (H) 70 - 99 mg/dL   BUN 19 8 - 23 mg/dL   Creatinine, Ser 1.05 (H) 0.44 - 1.00 mg/dL   Calcium 9.7 8.9 - 10.3 mg/dL   Total Protein 6.8 6.5 - 8.1 g/dL   Albumin 3.8 3.5 - 5.0 g/dL   AST 20 15 - 41 U/L   ALT 13 0 - 44 U/L   Alkaline Phosphatase 73 38 - 126 U/L    Total Bilirubin 0.4 0.3 - 1.2 mg/dL   GFR, Estimated 56 (L) >60 mL/min   Anion gap 7 5 - 15  Troponin I (High Sensitivity)     Status: None   Collection Time: 10/21/21  4:12 AM  Result Value Ref Range   Troponin I (High Sensitivity) 8 <18 ng/L  TSH     Status: None   Collection Time: 10/21/21  4:12 AM  Result Value Ref Range   TSH 1.969 0.350 - 4.500 uIU/mL  T4, free     Status: None  Collection Time: 10/21/21  4:12 AM  Result Value Ref Range   Free T4 0.91 0.61 - 1.12 ng/dL  Urinalysis, Routine w reflex microscopic Urine, Clean Catch     Status: Abnormal   Collection Time: 10/21/21  4:22 AM  Result Value Ref Range   Color, Urine YELLOW YELLOW   APPearance CLEAR CLEAR   Specific Gravity, Urine <1.005 (L) 1.005 - 1.030   pH 6.5 5.0 - 8.0   Glucose, UA NEGATIVE NEGATIVE mg/dL   Hgb urine dipstick NEGATIVE NEGATIVE   Bilirubin Urine NEGATIVE NEGATIVE   Ketones, ur NEGATIVE NEGATIVE mg/dL   Protein, ur NEGATIVE NEGATIVE mg/dL   Nitrite NEGATIVE NEGATIVE   Leukocytes,Ua NEGATIVE NEGATIVE  Troponin I (High Sensitivity)     Status: None   Collection Time: 10/21/21  6:35 AM  Result Value Ref Range   Troponin I (High Sensitivity) 8 <18 ng/L   ____________________________________________  EKG My review and personal interpretation at Time: 4:16   Indication: htn  Rate: 95  Rhythm: sinus Axis: normal Other: normal intervals, no stemi ____________________________________________  RADIOLOGY  I personally reviewed all radiographic images ordered to evaluate for the above acute complaints and reviewed radiology reports and findings.  These findings were personally discussed with the patient.  Please see medical record for radiology report.  ____________________________________________   PROCEDURES  Procedure(s) performed:  Procedures    Critical Care performed: no ____________________________________________   INITIAL IMPRESSION / ASSESSMENT AND PLAN / ED  COURSE  Pertinent labs & imaging results that were available during my care of the patient were reviewed by me and considered in my medical decision making (see chart for details).   DDX: htn, acs, chf, dissection, stress, cva, tia  Alica Shellhammer is a 75 y.o. who presents to the ED with presentation as described above.  Patient clinically very well-appearing in no acute distress.  EKG is nonischemic.  Serial enzymes are negative.  Does not seem consistent with ACS.  No sign of CHF.  Not clinically consistent with dissection.  No neuro findings or deficits to suggest TIA or CVA.  Primary concern is elevated blood pressure it is already come down now only 1 cystoscopy systolic.  She has not taken her blood pressure medication this morning.  Patient states that she would prefer to take her blood pressure medication at home.  She is reportedly having some increased blood pressure measurements at home will prescribe Norvasc until she is able to get back in with her PCP for further discussion and plan about management of the blood pressure.  Patient agreeable to plan.     The patient was evaluated in Emergency Department today for the symptoms described in the history of present illness. He/she was evaluated in the context of the global COVID-19 pandemic, which necessitated consideration that the patient might be at risk for infection with the SARS-CoV-2 virus that causes COVID-19. Institutional protocols and algorithms that pertain to the evaluation of patients at risk for COVID-19 are in a state of rapid change based on information released by regulatory bodies including the CDC and federal and state organizations. These policies and algorithms were followed during the patient's care in the ED.  As part of my medical decision making, I reviewed the following data within the [place] notes reviewed and incorporated, Labs reviewed, notes from prior ED visits and [place] Controlled  Substance Database   ____________________________________________   FINAL CLINICAL IMPRESSION(S) / ED DIAGNOSES  Final diagnoses:  Hypertension, unspecified  type      NEW MEDICATIONS STARTED DURING THIS VISIT:  New Prescriptions   AMLODIPINE (NORVASC) 5 MG TABLET    Take 1 tablet (5 mg total) by mouth daily.     Note:  This document was prepared using Dragon voice recognition software and may include unintentional dictation errors.    Merlyn Lot, MD 10/21/21 [phone]

## 2021-10-21 NOTE — ED Notes (Signed)
Pt reporting she would like to go home. RN verbalized waiting on medications from pharmacy but pt denies wanting to stay for them  Papers provided. No further questions.

## 2021-10-28 DIAGNOSIS — Z1231 Encounter for screening mammogram for malignant neoplasm of breast: Secondary | ICD-10-CM

## 2022-01-19 ENCOUNTER — Other Ambulatory Visit: Payer: Self-pay | Admitting: Nephrology

## 2022-01-19 DIAGNOSIS — R319 Hematuria, unspecified: Secondary | ICD-10-CM

## 2022-01-19 DIAGNOSIS — N183 Chronic kidney disease, stage 3 unspecified: Secondary | ICD-10-CM

## 2022-01-19 DIAGNOSIS — D631 Anemia in chronic kidney disease: Secondary | ICD-10-CM

## 2022-01-19 DIAGNOSIS — E1122 Type 2 diabetes mellitus with diabetic chronic kidney disease: Secondary | ICD-10-CM

## 2022-02-02 ENCOUNTER — Other Ambulatory Visit: Payer: Self-pay | Admitting: Family Medicine

## 2022-02-02 DIAGNOSIS — Z1231 Encounter for screening mammogram for malignant neoplasm of breast: Secondary | ICD-10-CM

## 2022-02-03 ENCOUNTER — Ambulatory Visit
Admission: RE | Admit: 2022-02-03 | Discharge: 2022-02-03 | Disposition: A | Discharge status: Home / Self Care | Payer: Medicare HMO | Source: Ambulatory Visit | Attending: Nephrology | Admitting: Nephrology

## 2022-02-03 ENCOUNTER — Other Ambulatory Visit: Payer: Self-pay

## 2022-02-03 DIAGNOSIS — E1122 Type 2 diabetes mellitus with diabetic chronic kidney disease: Secondary | ICD-10-CM | POA: Diagnosis present

## 2022-02-03 DIAGNOSIS — N189 Chronic kidney disease, unspecified: Secondary | ICD-10-CM | POA: Insufficient documentation

## 2022-02-03 DIAGNOSIS — R319 Hematuria, unspecified: Secondary | ICD-10-CM | POA: Diagnosis present

## 2022-02-03 DIAGNOSIS — D631 Anemia in chronic kidney disease: Secondary | ICD-10-CM | POA: Diagnosis present

## 2022-02-03 DIAGNOSIS — N183 Chronic kidney disease, stage 3 unspecified: Secondary | ICD-10-CM | POA: Insufficient documentation

## 2022-02-11 ENCOUNTER — Ambulatory Visit
Admission: RE | Admit: 2022-02-11 | Discharge: 2022-02-11 | Disposition: A | Discharge status: Home / Self Care | Payer: Medicare HMO | Source: Ambulatory Visit

## 2022-02-11 DIAGNOSIS — Z1231 Encounter for screening mammogram for malignant neoplasm of breast: Secondary | ICD-10-CM

## 2022-03-10 ENCOUNTER — Ambulatory Visit: Payer: Medicare HMO | Admitting: Gastroenterology

## 2022-03-26 HISTORY — PX: COLONOSCOPY: SHX174

## 2022-06-15 ENCOUNTER — Other Ambulatory Visit: Payer: Self-pay | Admitting: Orthopedic Surgery

## 2022-06-15 DIAGNOSIS — M75102 Unspecified rotator cuff tear or rupture of left shoulder, not specified as traumatic: Secondary | ICD-10-CM

## 2022-06-15 DIAGNOSIS — R29898 Other symptoms and signs involving the musculoskeletal system: Secondary | ICD-10-CM

## 2022-06-15 DIAGNOSIS — M19012 Primary osteoarthritis, left shoulder: Secondary | ICD-10-CM

## 2022-06-25 ENCOUNTER — Other Ambulatory Visit: Payer: Self-pay | Admitting: Nephrology

## 2022-06-25 ENCOUNTER — Other Ambulatory Visit (HOSPITAL_COMMUNITY): Payer: Self-pay | Admitting: Nephrology

## 2022-06-25 ENCOUNTER — Ambulatory Visit
Admission: RE | Admit: 2022-06-25 | Discharge: 2022-06-25 | Disposition: A | Discharge status: Home / Self Care | Payer: Medicare HMO | Source: Ambulatory Visit | Attending: Orthopedic Surgery | Admitting: Orthopedic Surgery

## 2022-06-25 DIAGNOSIS — R29898 Other symptoms and signs involving the musculoskeletal system: Secondary | ICD-10-CM | POA: Diagnosis present

## 2022-06-25 DIAGNOSIS — M19012 Primary osteoarthritis, left shoulder: Secondary | ICD-10-CM | POA: Insufficient documentation

## 2022-06-25 DIAGNOSIS — E210 Primary hyperparathyroidism: Secondary | ICD-10-CM

## 2022-06-25 DIAGNOSIS — M75102 Unspecified rotator cuff tear or rupture of left shoulder, not specified as traumatic: Secondary | ICD-10-CM | POA: Diagnosis present

## 2022-07-03 ENCOUNTER — Other Ambulatory Visit: Payer: Self-pay | Admitting: Surgery

## 2022-07-07 ENCOUNTER — Inpatient Hospital Stay: Admission: RE | Admit: 2022-07-07 | Payer: Medicare HMO | Source: Ambulatory Visit

## 2022-07-08 ENCOUNTER — Encounter: Payer: Self-pay | Admitting: Surgery

## 2022-07-08 ENCOUNTER — Encounter
Admission: RE | Admit: 2022-07-08 | Discharge: 2022-07-08 | Disposition: A | Discharge status: Home / Self Care | Payer: Medicare HMO | Source: Ambulatory Visit | Attending: Surgery | Admitting: Surgery

## 2022-07-08 VITALS — BP 172/82 | HR 78 | Temp 98.6°F | Resp 16 | Ht 62.5 in | Wt 153.0 lb

## 2022-07-08 DIAGNOSIS — B9620 Unspecified Escherichia coli [E. coli] as the cause of diseases classified elsewhere: Secondary | ICD-10-CM | POA: Diagnosis not present

## 2022-07-08 DIAGNOSIS — Z01812 Encounter for preprocedural laboratory examination: Secondary | ICD-10-CM

## 2022-07-08 DIAGNOSIS — Z01818 Encounter for other preprocedural examination: Secondary | ICD-10-CM | POA: Diagnosis not present

## 2022-07-08 DIAGNOSIS — R8271 Bacteriuria: Secondary | ICD-10-CM | POA: Insufficient documentation

## 2022-07-08 HISTORY — DX: Secondary hyperparathyroidism of renal origin: N25.81

## 2022-07-08 HISTORY — DX: Noninfective gastroenteritis and colitis, unspecified: K52.9

## 2022-07-08 HISTORY — DX: Unspecified osteoarthritis, unspecified site: M19.90

## 2022-07-08 HISTORY — DX: Gastro-esophageal reflux disease without esophagitis: K21.9

## 2022-07-08 HISTORY — DX: Other cervical disc degeneration, unspecified cervical region: M50.30

## 2022-07-08 HISTORY — DX: Cyst of kidney, acquired: N28.1

## 2022-07-08 HISTORY — DX: Chronic kidney disease, stage 3a: N18.31

## 2022-07-08 HISTORY — DX: Type 2 diabetes mellitus without complications: E11.9

## 2022-07-08 HISTORY — DX: Unspecified asthma, uncomplicated: J45.909

## 2022-07-08 LAB — TYPE AND SCREEN
ABO/RH(D): A POS
Antibody Screen: NEGATIVE

## 2022-07-08 LAB — CBC WITH DIFFERENTIAL/PLATELET
Abs Immature Granulocytes: 0.02 10*3/uL (ref 0.00–0.07)
Basophils Absolute: 0.1 10*3/uL (ref 0.0–0.1)
Basophils Relative: 1 %
Eosinophils Absolute: 0.5 10*3/uL (ref 0.0–0.5)
Eosinophils Relative: 8 %
HCT: 32.1 % — ABNORMAL LOW (ref 36.0–46.0)
Hemoglobin: 10.1 g/dL — ABNORMAL LOW (ref 12.0–15.0)
Immature Granulocytes: 0 %
Lymphocytes Relative: 29 %
Lymphs Abs: 2.1 10*3/uL (ref 0.7–4.0)
MCH: 26.4 pg (ref 26.0–34.0)
MCHC: 31.5 g/dL (ref 30.0–36.0)
MCV: 83.8 fL (ref 80.0–100.0)
Monocytes Absolute: 0.5 10*3/uL (ref 0.1–1.0)
Monocytes Relative: 8 %
Neutro Abs: 3.9 10*3/uL (ref 1.7–7.7)
Neutrophils Relative %: 54 %
Platelets: 404 10*3/uL — ABNORMAL HIGH (ref 150–400)
RBC: 3.83 MIL/uL — ABNORMAL LOW (ref 3.87–5.11)
RDW: 14.1 % (ref 11.5–15.5)
WBC: 7.1 10*3/uL (ref 4.0–10.5)
nRBC: 0 % (ref 0.0–0.2)

## 2022-07-08 LAB — URINALYSIS, ROUTINE W REFLEX MICROSCOPIC
Bilirubin Urine: NEGATIVE
Glucose, UA: NEGATIVE mg/dL
Hgb urine dipstick: NEGATIVE
Ketones, ur: NEGATIVE mg/dL
Leukocytes,Ua: NEGATIVE
Nitrite: POSITIVE — AB
Protein, ur: NEGATIVE mg/dL
Specific Gravity, Urine: 1.009 (ref 1.005–1.030)
pH: 5 (ref 5.0–8.0)

## 2022-07-08 LAB — COMPREHENSIVE METABOLIC PANEL
ALT: 19 U/L (ref 0–44)
AST: 22 U/L (ref 15–41)
Albumin: 4.2 g/dL (ref 3.5–5.0)
Alkaline Phosphatase: 79 U/L (ref 38–126)
Anion gap: 8 (ref 5–15)
BUN: 20 mg/dL (ref 8–23)
CO2: 24 mmol/L (ref 22–32)
Calcium: 10.3 mg/dL (ref 8.9–10.3)
Chloride: 107 mmol/L (ref 98–111)
Creatinine, Ser: 1.16 mg/dL — ABNORMAL HIGH (ref 0.44–1.00)
GFR, Estimated: 49 mL/min — ABNORMAL LOW (ref >60)
Glucose, Bld: 106 mg/dL — ABNORMAL HIGH (ref 70–99)
Potassium: 4.3 mmol/L (ref 3.5–5.1)
Sodium: 139 mmol/L (ref 135–145)
Total Bilirubin: 0.7 mg/dL (ref 0.3–1.2)
Total Protein: 7.3 g/dL (ref 6.5–8.1)

## 2022-07-08 LAB — SURGICAL PCR SCREEN
MRSA, PCR: NEGATIVE
Staphylococcus aureus: NEGATIVE

## 2022-07-08 NOTE — Patient Instructions (Addendum)
Your procedure is scheduled on: Tuesday, August 22 Report to the Registration Desk on the 1st floor of the [facility]. To find out your arrival time, please call [phone] between 1PM - 3PM on: Monday, August 21 If your arrival time is 6:00 am, do not arrive prior to that time as the [place] entrance doors do not open until 6:00 am.  REMEMBER: Instructions that are not followed completely may result in serious medical risk, up to and including death; or upon the discretion of your surgeon and anesthesiologist your surgery may need to be rescheduled.  Do not eat food after midnight the night before surgery.  No gum chewing, lozengers or hard candies.  You may however, drink water up to 2 hours before you are scheduled to arrive for your surgery. Do not drink anything within 2 hours of your scheduled arrival time.  In addition, your doctor has ordered for you to drink the provided  Gatorade G2 Drinking this carbohydrate drink up to two hours before surgery helps to reduce insulin resistance and improve patient outcomes. Please complete drinking 2 hours prior to scheduled arrival time.  TAKE THESE MEDICATIONS THE MORNING OF SURGERY WITH A SIP OF WATER:  Diltiazem  Metformin - hold 2 days PRIOR to surgery. Last day to take metformin is Saturday, August 19. Resume AFTER surgery.  One week prior to surgery: starting today, August 16 Stop aspirin and Anti-inflammatories (NSAIDS) such as Advil, Aleve, Ibuprofen, Motrin, Naproxen, Naprosyn and Aspirin based products such as Excedrin, Goodys Powder, BC Powder. Stop ANY OVER THE COUNTER supplements until after surgery. You may however, continue to take Tylenol if needed for pain up until the day of surgery.  No Alcohol for 24 hours before or after surgery.  No Smoking including e-cigarettes for 24 hours prior to surgery.  No chewable tobacco products for at least 6 hours prior to surgery.  No nicotine patches on the day of  surgery.  Do not use any "recreational" drugs for at least a week prior to your surgery.  Please be advised that the combination of cocaine and anesthesia may have negative outcomes, up to and including death. If you test positive for cocaine, your surgery will be cancelled.  On the morning of surgery brush your teeth with toothpaste and water, you may rinse your mouth with mouthwash if you wish. Do not swallow any toothpaste or mouthwash.  Use CHG Soap as directed on instruction sheet.  Do not wear jewelry, make-up, hairpins, clips or nail polish.  Do not wear lotions, powders, or perfumes.   Do not shave body from the neck down 48 hours prior to surgery just in case you cut yourself which could leave a site for infection.  Also, freshly shaved skin may become irritated if using the CHG soap.  Contact lenses, hearing aids and dentures may not be worn into surgery.  Do not bring valuables to the hospital. [facility] is not responsible for any missing/lost belongings or valuables.   Total Shoulder Arthroplasty:  use Benzolyl Peroxide 5% Gel as directed on instruction sheet.  Notify your doctor if there is any change in your medical condition (cold, fever, infection).  Wear comfortable clothing (specific to your surgery type) to the hospital.  After surgery, you can help prevent lung complications by doing breathing exercises.  Take deep breaths and cough every 1-2 hours. Your doctor may order a device called an Incentive Spirometer to help you take deep breaths.  If you are being  admitted to the hospital overnight, leave your suitcase in the car. After surgery it may be brought to your room.  If you are being discharged the day of surgery, you will not be allowed to drive home. You will need a responsible adult (18 years or older) to drive you home and stay with you that night.   If you are taking public transportation, you will need to have a responsible adult (18 years or  older) with you. Please confirm with your physician that it is acceptable to use public transportation.   Please call the [facility] Dept. at [phone] if you have any questions about these instructions.  Surgery Visitation Policy:  Patients undergoing a surgery or procedure may have two family members or support persons with them as long as the person is not COVID-19 positive or experiencing its symptoms.   Inpatient Visitation:    Visiting hours are 7 a.m. to 8 p.m. Up to four visitors are allowed at one time in a patient room, including children. The visitors may rotate out with other people during the day. One designated support person (adult) may remain overnight.   Preparing for Surgery with [place] (CHG) Soap    Before surgery, you can play an important role by reducing the number of germs on your skin.  CHG (Chlorhexidine gluconate) soap is an antiseptic cleanser which kills germs and bonds with the skin to continue killing germs even after washing.  Please do not use if you have an allergy to CHG or antibacterial soaps. If your skin becomes reddened/irritated stop using the CHG.  1. Shower the NIGHT BEFORE SURGERY and the MORNING OF SURGERY with CHG soap.  2. If you choose to wash your hair, wash your hair first as usual with your normal shampoo.  3. After shampooing, rinse your hair and body thoroughly to remove the shampoo.  4. Use CHG as you would any other liquid soap. You can apply CHG directly to the skin and wash gently with a scrungie or a clean washcloth.  5. Apply the CHG soap to your body only from the neck down. Do not use on open wounds or open sores. Avoid contact with your eyes, ears, mouth, and genitals (private parts). Wash face and genitals (private parts) with your normal soap.  6. Wash thoroughly, paying special attention to the area where your surgery will be performed.  7. Thoroughly rinse your body with warm  water.  8. Do not shower/wash with your normal soap after using and rinsing off the CHG soap.  9. Pat yourself dry with a clean towel.  10. Wear clean pajamas to bed the night before surgery.  12. Place clean sheets on your bed the night of your first shower and do not sleep with pets.  13. Shower again with the CHG soap on the day of surgery prior to arriving at the hospital.  14. Do not apply any deodorants/lotions/powders.  15. Please wear clean clothes to the hospital.

## 2022-07-09 NOTE — Progress Notes (Signed)
Perioperative Services  Pre-Admission/Anesthesia Testing Clinical Review  Date: 07/10/22  Patient Demographics:  Name: Joyce Mckinney DOB:   1946-03-16 MRN:   [id number]  Planned Surgical Procedure(s):    Case: [id number] Date/Time: 07/14/22 1127   Procedure: REVERSE SHOULDER ARTHROPLASTY WITH BICEPS TENODESIS (Left: Shoulder)   Anesthesia type: Choice   Pre-op diagnosis: primary osteoarthritis of left shoulder   Location: [facility] OR ROOM 03 / [place] ORS FOR ANESTHESIA GROUP   Surgeons: Corky Mull, MD   NOTE: Available PAT nursing documentation and vital signs have been reviewed. Clinical nursing staff has updated patient's PMH/PSHx, current medication list, and drug allergies/intolerances to ensure comprehensive history available to assist in medical decision making as it pertains to the aforementioned surgical procedure and anticipated anesthetic course. Extensive review of available clinical information performed. Joyce Mckinney PMH and PSHx updated with any diagnoses/procedures that  may have been inadvertently omitted during her intake with the pre-admission testing department's nursing staff.  Clinical Discussion:  Joyce Mckinney is a 76 y.o. female who is submitted for pre-surgical anesthesia review and clearance prior to her undergoing the above procedure. Patient has never been a smoker. Pertinent PMH includes: palpitations, diastolic dysfunction, angina at rest, HTN, HLD, T2DM, multinodular goiter, hyperparathyroidism, asthma, shortness of breath, CKD-III, GERD (no daily Tx), anemia, OA, cervical and lumbar DDD (s/p fusions).   Patient is followed by cardiology (: Would, MD). She was last seen in the cardiology clinic on 01/08/2022; notes reviewed. At the time of her clinic visit, the patient denied any chest pain, shortness of breath, PND, orthopnea, palpitations, significant peripheral edema, vertiginous symptoms, or presyncope/syncope.  Patient with a PMH significant for  cardiovascular diagnoses.  TTE performed on 11/25/2021 revealed a normal left ventricular systolic function with mild concentric LVH; LVEF >55%. Diastolic Doppler parameters consistent with abnormal relaxation (G1DD).  There was mild biatrial enlargement.  Trivial atrial/pulmonary and mild mitral/tricuspid valve regurgitation noted.  There was no evidence of a significant transvalvular gradient to suggest stenosis.  Myocardial perfusion imaging study performed on 11/28/2021 revealed a normal left ventricular systolic function with EF 63%.  There was normal myocardial thickening and wall motion.  No artifact is noted.  Left ventricular cavity size normal.  There was no evidence of stress-induced myocardial ischemia or arrhythmia; no scintigraphic evidence of scar. Study determined to be normal and low risk.  Blood pressure elevated at 160/86 on currently prescribed CCB, ARB, and diuretic therapies.  Patient is on atorvastatin for her HLD diagnosis and ASCVD prevention.  T2DM well-controlled on currently prescribed regimen; last HgbA1c was 6.2% when checked on 05/13/2022.  Patient does not have an OSAH diagnosis. Functional capacity, as defined by DASI, is documented as being >/= 4 METS.  No changes were made to her medication regimen.  Patient follow-up with outpatient cardiology in 1 year or sooner if needed.  Joyce Mckinney is scheduled for an elective LEFT REVERSE SHOULDER ARTHROPLASTY WITH BICEPS TENODESIS on 07/14/2022 with Dr. Milagros Evener, MD. Given patient's past medical history significant for cardiovascular diagnoses, presurgical cardiac clearance was sought by the PAT team. Per cardiology, "this patient is optimized for surgery and may proceed with the planned procedural course with a MODERATE risk of significant perioperative cardiovascular complications".  In review of her medication reconciliation, it is noted the patient is on daily antiplatelet therapy. She has been instructed on  recommendations from her cardiologist for holding her daily low-dose ASA for 5 days prior to her procedure with plans to restart  as soon as postoperative bleeding risk felt to be minimized by her primary attending surgeon. The patient is aware that her last dose of ASA should be on 07/08/2022.  Patient denies previous perioperative complications with anesthesia in the past. In review of the available records, it is noted that patient underwent a general anesthetic course at [facility] (ASA II) in 12/2019 without documented complications.      07/08/2022    8:47 AM 10/21/2021    8:30 AM 10/21/2021    8:15 AM  Vitals with BMI  Height 5' 2.5"    Weight 153 lbs    BMI 35.46    Systolic [phone]  Diastolic 82 95 85  Pulse 78 90 79    Providers/Specialists:   NOTE: Primary physician provider listed below. Patient may have been seen by APP or partner within same practice.   PROVIDER ROLE / SPECIALTY LAST OV  Poggi, Marshall Cork, MD Orthopedics (Surgeon) 06/29/2022  Margarita Rana, MD Primary Care Provider 04/09/2022  Katrine Coho, MD Cardiology 01/08/2022  Lyla Son, MD Nephrology 05/20/2022   Allergies:  Morphine and related  Current Home Medications:   No current facility-administered medications for this encounter.    aspirin EC 81 MG tablet   atorvastatin (LIPITOR) 40 MG tablet   cephALEXin (KEFLEX) 500 MG capsule   diltiazem (CARDIZEM CD) 240 MG 24 hr capsule   gabapentin (NEURONTIN) 100 MG capsule   ibuprofen (ADVIL) 200 MG tablet   latanoprost (XALATAN) 0.005 % ophthalmic solution   losartan (COZAAR) 50 MG tablet   metFORMIN (GLUCOPHAGE-XR) 750 MG 24 hr tablet   olmesartan (BENICAR) 40 MG tablet   torsemide (DEMADEX) 10 MG tablet   History:   Past Medical History:  Diagnosis Date   Anemia    Arthritis    Asthma    Chest pain at rest    Chronic kidney disease, stage 3a (HCC)    Degenerative disc disease, cervical    Diastolic  dysfunction    a.) TTE 11/25/2021: EF >55%, mild LVH, mild BAE, triv AR/PR, mild MR/TR, G1DD   Diverticulitis    GERD (gastroesophageal reflux disease)    Hyperlipidemia    Hyperparathyroidism, secondary (HCC)    Hypertension    IBD (inflammatory bowel disease)    Microscopic hematuria    Multinodular goiter    Obesity    Palpitation    Reflux gastritis    Renal cyst, left    SOB (shortness of breath)    Tubular adenoma of colon    Type 2 diabetes mellitus without complication [facility])    Past Surgical History:  Procedure Laterality Date   ANTERIOR CERVICAL DECOMP/DISCECTOMY FUSION     x2   APPENDECTOMY     BILATERAL SALPINGOOPHORECTOMY     COLONOSCOPY  04/10/2015   COLONOSCOPY  03/26/2022   LUMBAR FUSION  01/04/2020   L3-5     Dr. Deetta Perla   SHOULDER ARTHROSCOPY W/ ROTATOR CUFF REPAIR Left    TOTAL HIP ARTHROPLASTY Right 03/06/2015   TOTAL HIP ARTHROPLASTY Left 01/12/2017   TUBAL LIGATION     VAGINAL HYSTERECTOMY     Family History  Problem Relation Age of Onset   Diabetes Mother    Hypertension Mother    Lymphoma Mother    Parkinsonism Mother    Glaucoma Mother    Leukemia Father    Diabetes Sister    Diabetes Brother    Coronary artery disease Brother    Lung cancer Brother  Social History   Tobacco Use   Smoking status: Never   Smokeless tobacco: Never  Vaping Use   Vaping Use: Never used  Substance Use Topics   Alcohol use: Yes    Comment: rarely   Drug use: Never    Pertinent Clinical Results:  LABS: Labs reviewed: Acceptable for surgery.  Hospital Outpatient Visit on 07/08/2022  Component Date Value Ref Range Status   MRSA, PCR 07/08/2022 NEGATIVE  NEGATIVE Final   Staphylococcus aureus 07/08/2022 NEGATIVE  NEGATIVE Final   Comment: (NOTE) The Xpert SA Assay (FDA approved for NASAL specimens in patients 77 years of age and older), is one component of a comprehensive surveillance program. It is not intended to diagnose infection nor  to guide or monitor treatment. Performed at [facility], [address] [place], [place] [address]    WBC 07/08/2022 7.1  4.0 - 10.5 K/uL Final   RBC 07/08/2022 3.83 (L)  3.87 - 5.11 MIL/uL Final   Hemoglobin 07/08/2022 10.1 (L)  12.0 - 15.0 g/dL Final   HCT 07/08/2022 32.1 (L)  36.0 - 46.0 % Final   MCV 07/08/2022 83.8  80.0 - 100.0 fL Final   MCH 07/08/2022 26.4  26.0 - 34.0 pg Final   MCHC 07/08/2022 31.5  30.0 - 36.0 g/dL Final   RDW 07/08/2022 14.1  11.5 - 15.5 % Final   Platelets 07/08/2022 404 (H)  150 - 400 K/uL Final   nRBC 07/08/2022 0.0  0.0 - 0.2 % Final   Neutrophils Relative % 07/08/2022 54  % Final   Neutro Abs 07/08/2022 3.9  1.7 - 7.7 K/uL Final   Lymphocytes Relative 07/08/2022 29  % Final   Lymphs Abs 07/08/2022 2.1  0.7 - 4.0 K/uL Final   Monocytes Relative 07/08/2022 8  % Final   Monocytes Absolute 07/08/2022 0.5  0.1 - 1.0 K/uL Final   Eosinophils Relative 07/08/2022 8  % Final   Eosinophils Absolute 07/08/2022 0.5  0.0 - 0.5 K/uL Final   Basophils Relative 07/08/2022 1  % Final   Basophils Absolute 07/08/2022 0.1  0.0 - 0.1 K/uL Final   Immature Granulocytes 07/08/2022 0  % Final   Abs Immature Granulocytes 07/08/2022 0.02  0.00 - 0.07 K/uL Final   Performed at [facility], [place], [place] [address]   Sodium 07/08/2022 139  135 - 145 mmol/L Final   Potassium 07/08/2022 4.3  3.5 - 5.1 mmol/L Final   Chloride 07/08/2022 107  98 - 111 mmol/L Final   CO2 07/08/2022 24  22 - 32 mmol/L Final   Glucose, Bld 07/08/2022 106 (H)  70 - 99 mg/dL Final   Glucose reference range applies only to samples taken after fasting for at least 8 hours.   BUN 07/08/2022 20  8 - 23 mg/dL Final   Creatinine, Ser 07/08/2022 1.16 (H)  0.44 - 1.00 mg/dL Final   Calcium 07/08/2022 10.3  8.9 - 10.3 mg/dL Final   Total Protein 07/08/2022 7.3  6.5 - 8.1 g/dL Final   Albumin 07/08/2022 4.2  3.5 - 5.0 g/dL Final   AST 07/08/2022 22  15 - 41 U/L Final    ALT 07/08/2022 19  0 - 44 U/L Final   Alkaline Phosphatase 07/08/2022 79  38 - 126 U/L Final   Total Bilirubin 07/08/2022 0.7  0.3 - 1.2 mg/dL Final   GFR, Estimated 07/08/2022 49 (L)  >60 mL/min Final   Comment: (NOTE) Calculated using the CKD-EPI Creatinine Equation (  2021)    Anion gap 07/08/2022 8  5 - 15 Final   Performed at [facility], [place], [place] [address]   Color, Urine 07/08/2022 YELLOW (A)  YELLOW Final   APPearance 07/08/2022 HAZY (A)  CLEAR Final   Specific Gravity, Urine 07/08/2022 1.009  1.005 - 1.030 Final   pH 07/08/2022 5.0  5.0 - 8.0 Final   Glucose, UA 07/08/2022 NEGATIVE  NEGATIVE mg/dL Final   Hgb urine dipstick 07/08/2022 NEGATIVE  NEGATIVE Final   Bilirubin Urine 07/08/2022 NEGATIVE  NEGATIVE Final   Ketones, ur 07/08/2022 NEGATIVE  NEGATIVE mg/dL Final   Protein, ur 07/08/2022 NEGATIVE  NEGATIVE mg/dL Final   Nitrite 07/08/2022 POSITIVE (A)  NEGATIVE Final   Leukocytes,Ua 07/08/2022 NEGATIVE  NEGATIVE Final   RBC / HPF 07/08/2022 0-5  0 - 5 RBC/hpf Final   WBC, UA 07/08/2022 0-5  0 - 5 WBC/hpf Final   Bacteria, UA 07/08/2022 RARE (A)  NONE SEEN Final   Squamous Epithelial / LPF 07/08/2022 0-5  0 - 5 Final   Mucus 07/08/2022 PRESENT   Final   Performed at [facility], [address] [place], [place] [address]   ABO/RH(D) 07/08/2022 A POS   Final   Antibody Screen 07/08/2022 NEG   Final   Sample Expiration 07/08/2022 07/22/2022,2359   Final   Extend sample reason 07/08/2022    Final                   Value:NO TRANSFUSIONS OR PREGNANCY IN THE PAST 3 MONTHS Performed at [facility], [address] [place], [place] [address]     ECG: Date: 07/08/2022 Time ECG obtained: 0851 AM Rate: 65 bpm Rhythm: normal sinus Axis (leads I and aVF): Left axis deviation Intervals: PR 160 ms. QRS 78 ms. QTc 407 ms. ST segment and T wave changes: No evidence of acute ST segment elevation or depression Comparison:  Similar to previous tracing obtained on 10/21/2021   IMAGING / PROCEDURES: MRI SHOULDER LEFT WO CONTRAST performed on 06/25/2022 Severe glenohumeral joint osteoarthritis. Moderate rotator cuff tendinosis with low-grade partial-thickness insertional tears of the supraspinatus greater than infraspinatus tendons. No high-grade or full-thickness rotator cuff tear. Moderate acromioclavicular osteoarthritis.  MYOCARDIAL PERFUSION IMAGING STUDY (LEXISCAN) performed on 11/28/2021 Normal left ventricular systolic function with EF 63% Normal myocardial thickening and wall motion  No artifact Left ventricular cavity size normal No evidence of stress-induced myocardial ischemia or arrhythmia; no scintigraphic evidence of scar Normal low risk study  TRANSTHORACIC ECHOCARDIOGRAM performed on 11/25/2021 Normal left ventricular systolic function with EF >55% Mild concentric LVH Diastolic Doppler parameters consistent with abnormal relaxation (G1DD). Mild biatrial enlargement Trivial AR and PR Mild MR and TR Normal gradients; no stenosis No pericardial effusion  Impression and Plan:  Joyce Mckinney has been referred for pre-anesthesia review and clearance prior to her undergoing the planned anesthetic and procedural courses. Available labs, pertinent testing, and imaging results were personally reviewed by me. This patient has been appropriately cleared by cardiology with an overall MODERATE risk of significant perioperative cardiovascular complications.  Based on clinical review performed today (07/10/22), barring any significant acute changes in the patient's overall condition, it is anticipated that she will be able to proceed with the planned surgical intervention. Any acute changes in clinical condition may necessitate her procedure being postponed and/or cancelled. Patient will meet with anesthesia team (MD and/or CRNA) on the day of her procedure for preoperative evaluation/assessment.  Questions regarding anesthetic  course will be fielded at that time.   Pre-surgical instructions were reviewed with the patient during her PAT appointment and questions were fielded by PAT clinical staff. Patient was advised that if any questions or concerns arise prior to her procedure then she should return a call to PAT and/or her surgeon's office to discuss.  Joyce Loh, MSN, APRN, FNP-C, CEN [facility]  Peri-operative Services Nurse Practitioner Phone: [phone] Fax: [phone] 07/10/22 1:47 PM  NOTE: This note has been prepared using Dragon dictation software. Despite my best ability to proofread, there is always the potential that unintentional transcriptional errors may still occur from this process.

## 2022-07-10 ENCOUNTER — Telehealth: Payer: Self-pay | Admitting: Urgent Care

## 2022-07-10 ENCOUNTER — Ambulatory Visit
Admission: RE | Admit: 2022-07-10 | Discharge: 2022-07-10 | Disposition: A | Discharge status: Home / Self Care | Payer: Medicare HMO | Source: Ambulatory Visit | Attending: Nephrology | Admitting: Nephrology

## 2022-07-10 DIAGNOSIS — E210 Primary hyperparathyroidism: Secondary | ICD-10-CM | POA: Insufficient documentation

## 2022-07-10 DIAGNOSIS — Z01812 Encounter for preprocedural laboratory examination: Secondary | ICD-10-CM

## 2022-07-10 DIAGNOSIS — B9620 Unspecified Escherichia coli [E. coli] as the cause of diseases classified elsewhere: Secondary | ICD-10-CM

## 2022-07-10 LAB — URINE CULTURE: Culture: >=100000 — AB

## 2022-07-10 MED ORDER — CEPHALEXIN 500 MG PO CAPS: 500.0000 mg | ORAL_CAPSULE | Freq: Three times a day (TID) | ORAL | 0 refills | Status: AC

## 2022-07-10 NOTE — Progress Notes (Signed)
[facility] Perioperative Services: Pre-Admission/Anesthesia Testing  Abnormal Lab Notification and Treatment Plan of Care   Date: 07/10/22  Name: Joyce Mckinney MRN:   [id number]  Re: Abnormal labs noted during PAT appointment   Notified:  Provider Name Provider Role Notification Mode  Milagros Evener, MD Orthopedics (Surgeon) Routed and/or faxed via [facility]   Abnormal Lab Value(s):   Lab Results  Component Value Date   COLORURINE YELLOW (A) 07/08/2022   APPEARANCEUR HAZY (A) 07/08/2022   LABSPEC 1.009 07/08/2022   PHURINE 5.0 07/08/2022   GLUCOSEU NEGATIVE 07/08/2022   HGBUR NEGATIVE 07/08/2022   BILIRUBINUR NEGATIVE 07/08/2022   KETONESUR NEGATIVE 07/08/2022   PROTEINUR NEGATIVE 07/08/2022   NITRITE POSITIVE (A) 07/08/2022   LEUKOCYTESUR NEGATIVE 07/08/2022   EPIU 0-5 07/08/2022   WBCU 0-5 07/08/2022   RBCU 0-5 07/08/2022   BACTERIA RARE (A) 07/08/2022   CULT >=100,000 COLONIES/mL ESCHERICHIA COLI (A) 07/08/2022   Clinical Information and Notes:  Patient is scheduled for an elective REVERSE SHOULDER ARTHROPLASTY WITH BICEPS TENODESIS (LEFT) on 07/14/2022.    UA performed in PAT consistent with/concerning for infection.  No leukocytosis noted on CBC; WBC 7100 Renal function: Estimated Creatinine Clearance: 39.3 mL/min (A) (by C-G formula based on SCr of 1.16 mg/dL (H)). Urine C&S added to assess for pathogenically significant growth.  Impression and Plan:  Harl Favor with a UA that was (+) for infection; reflex culture sent. Culture grew out Escherichia coli. Contacted patient to discuss. Patient reporting that she is experiencing urinary frequency, urgency, and very mild dysuria. She denies any abdominal or back pain, nausea, vomiting, and fevers.  Patient with surgery scheduled soon. In efforts to avoid delaying patient's procedure, or have her experience any potentially significant perioperative complications related to the  aforementioned, I would like to proceed with treatment for urinary tract infection.  Allergies reviewed. Culture report also reviewed to ensure culture appropriate coverage is being provided. Will treat with a 5 day course of CEPHALEXIN. Patient encouraged to complete the entire course of antibiotics even if she begins to feel better.   Meds ordered this encounter  Medications   cephALEXin (KEFLEX) 500 MG capsule    Sig: Take 1 capsule (500 mg total) by mouth 3 (three) times daily for 5 days. Increase WATER intake while taking this medication.    Dispense:  15 capsule    Refill:  0   Patient encouraged to increase her fluid intake as much as possible. Discussed that water is always best to flush the urinary tract. She was advised to avoid caffeine containing fluids until her infections clears, as caffeine can cause her to experience painful bladder spasms.   May use Tylenol as needed for pain/fever should she experience these symptoms.   Patient instructed to call surgeon's office or PAT with any questions or concerns related to the above outlined course of treatment. Additionally, she was instructed to call if she feels like she is getting worse overall while on treatment. Results and treatment plan of care forwarded to primary attending surgeon to make them aware.   Encounter Diagnoses  Name Primary?   Pre-operative laboratory examination Yes   E. coli UTI (urinary tract infection)     Honor Loh, MSN, APRN, FNP-C, CEN [facility]  Peri-operative Services Nurse Practitioner Phone: [phone] Fax: [phone] 07/10/22 10:46 AM  NOTE: This note has been prepared using Dragon dictation software. Despite my best ability to proofread, there is always the potential that  unintentional transcriptional errors may still occur from this process.

## 2022-07-14 ENCOUNTER — Ambulatory Visit: Payer: Medicare HMO | Admitting: Urgent Care

## 2022-07-14 ENCOUNTER — Encounter
Admission: RE | Disposition: A | Discharge status: Home / Self Care | Payer: Self-pay | Source: Ambulatory Visit | Attending: Surgery

## 2022-07-14 ENCOUNTER — Ambulatory Visit
Admission: RE | Admit: 2022-07-14 | Discharge: 2022-07-14 | Disposition: A | Discharge status: Home / Self Care | Payer: Medicare HMO | Source: Ambulatory Visit | Attending: Surgery | Admitting: Surgery

## 2022-07-14 ENCOUNTER — Ambulatory Visit: Payer: Medicare HMO

## 2022-07-14 ENCOUNTER — Encounter: Payer: Self-pay | Admitting: Surgery

## 2022-07-14 ENCOUNTER — Other Ambulatory Visit: Payer: Self-pay

## 2022-07-14 DIAGNOSIS — M19012 Primary osteoarthritis, left shoulder: Secondary | ICD-10-CM | POA: Insufficient documentation

## 2022-07-14 DIAGNOSIS — N1831 Chronic kidney disease, stage 3a: Secondary | ICD-10-CM | POA: Insufficient documentation

## 2022-07-14 DIAGNOSIS — R8290 Unspecified abnormal findings in urine: Secondary | ICD-10-CM

## 2022-07-14 DIAGNOSIS — E1122 Type 2 diabetes mellitus with diabetic chronic kidney disease: Secondary | ICD-10-CM | POA: Insufficient documentation

## 2022-07-14 DIAGNOSIS — K219 Gastro-esophageal reflux disease without esophagitis: Secondary | ICD-10-CM | POA: Insufficient documentation

## 2022-07-14 DIAGNOSIS — I129 Hypertensive chronic kidney disease with stage 1 through stage 4 chronic kidney disease, or unspecified chronic kidney disease: Secondary | ICD-10-CM | POA: Insufficient documentation

## 2022-07-14 DIAGNOSIS — Z01812 Encounter for preprocedural laboratory examination: Secondary | ICD-10-CM

## 2022-07-14 HISTORY — DX: Palpitations: R00.2

## 2022-07-14 HISTORY — DX: Shortness of breath: R06.02

## 2022-07-14 HISTORY — DX: Nontoxic multinodular goiter: E04.2

## 2022-07-14 HISTORY — DX: Benign neoplasm of colon, unspecified: D12.6

## 2022-07-14 HISTORY — DX: Chest pain, unspecified: R07.9

## 2022-07-14 HISTORY — PX: REVERSE SHOULDER ARTHROPLASTY: SHX5054

## 2022-07-14 HISTORY — DX: Other ill-defined heart diseases: I51.89

## 2022-07-14 LAB — GLUCOSE, CAPILLARY
Glucose-Capillary: 111 mg/dL — ABNORMAL HIGH (ref 70–99)
Glucose-Capillary: 166 mg/dL — ABNORMAL HIGH (ref 70–99)

## 2022-07-14 LAB — ABO/RH: ABO/RH(D): A POS

## 2022-07-14 SURGERY — ARTHROPLASTY, SHOULDER, TOTAL, REVERSE
Anesthesia: General | Site: Shoulder | Laterality: Left

## 2022-07-14 MED ORDER — ONDANSETRON HCL 4 MG/2ML IJ SOLN
INTRAMUSCULAR | Status: DC | PRN
  Administered 2022-07-14: 4 mg via INTRAVENOUS

## 2022-07-14 MED ORDER — SUGAMMADEX SODIUM 200 MG/2ML IV SOLN
INTRAVENOUS | Status: DC | PRN
  Administered 2022-07-14 (×2): 100 mg via INTRAVENOUS

## 2022-07-14 MED ORDER — BUPIVACAINE LIPOSOME 1.3 % IJ SUSP
INTRAMUSCULAR | Status: AC
  Filled 2022-07-14: qty 10

## 2022-07-14 MED ORDER — TRANEXAMIC ACID 1000 MG/10ML IV SOLN
INTRAVENOUS | Status: DC | PRN
  Administered 2022-07-14: 1000 mg via INTRAVENOUS

## 2022-07-14 MED ORDER — LIDOCAINE HCL (PF) 1 % IJ SOLN
INTRAMUSCULAR | Status: DC | PRN
  Administered 2022-07-14: 1 mL

## 2022-07-14 MED ORDER — DROPERIDOL 2.5 MG/ML IJ SOLN: 0.6250 mg | Freq: Once | INTRAMUSCULAR | Status: DC | PRN

## 2022-07-14 MED ORDER — DEXAMETHASONE SODIUM PHOSPHATE 10 MG/ML IJ SOLN
INTRAMUSCULAR | Status: AC
Start: 2022-07-14 — End: ?
  Filled 2022-07-14: qty 1

## 2022-07-14 MED ORDER — ACETAMINOPHEN 10 MG/ML IV SOLN: 1000.0000 mg | Freq: Once | INTRAVENOUS | Status: DC | PRN

## 2022-07-14 MED ORDER — SODIUM CHLORIDE 0.9 % IV SOLN
INTRAVENOUS | Status: DC | PRN
  Administered 2022-07-14: 10 mL

## 2022-07-14 MED ORDER — ACETAMINOPHEN 500 MG PO TABS: 1000.0000 mg | ORAL_TABLET | Freq: Four times a day (QID) | ORAL | Status: DC

## 2022-07-14 MED ORDER — ONDANSETRON HCL 4 MG/2ML IJ SOLN
INTRAMUSCULAR | Status: AC
Start: 2022-07-14 — End: ?
  Filled 2022-07-14: qty 2

## 2022-07-14 MED ORDER — CEFAZOLIN SODIUM-DEXTROSE 2-4 GM/100ML-% IV SOLN
INTRAVENOUS | Status: AC
  Filled 2022-07-14: qty 100

## 2022-07-14 MED ORDER — SODIUM CHLORIDE 0.9 % IV SOLN: INTRAVENOUS | Status: DC

## 2022-07-14 MED ORDER — PHENYLEPHRINE 80 MCG/ML (10ML) SYRINGE FOR IV PUSH (FOR BLOOD PRESSURE SUPPORT)
PREFILLED_SYRINGE | INTRAVENOUS | Status: AC
  Filled 2022-07-14: qty 10

## 2022-07-14 MED ORDER — PROPOFOL 10 MG/ML IV BOLUS
INTRAVENOUS | Status: AC
  Filled 2022-07-14: qty 20

## 2022-07-14 MED ORDER — CHLORHEXIDINE GLUCONATE 0.12 % MT SOLN: 15.0000 mL | Freq: Once | OROMUCOSAL | Status: AC

## 2022-07-14 MED ORDER — OXYCODONE HCL 5 MG/5ML PO SOLN: 5.0000 mg | Freq: Once | ORAL | Status: DC | PRN

## 2022-07-14 MED ORDER — TRIAMCINOLONE ACETONIDE 40 MG/ML IJ SUSP
INTRAMUSCULAR | Status: AC
  Filled 2022-07-14: qty 1

## 2022-07-14 MED ORDER — PROPOFOL 10 MG/ML IV BOLUS
INTRAVENOUS | Status: DC | PRN
  Administered 2022-07-14: 140 mg via INTRAVENOUS
  Administered 2022-07-14: 40 mg via INTRAVENOUS

## 2022-07-14 MED ORDER — BUPIVACAINE-EPINEPHRINE (PF) 0.5% -1:200000 IJ SOLN
INTRAMUSCULAR | Status: DC | PRN
  Administered 2022-07-14: 30 mL via PERINEURAL

## 2022-07-14 MED ORDER — PROMETHAZINE HCL 25 MG/ML IJ SOLN: 6.2500 mg | INTRAMUSCULAR | Status: DC | PRN

## 2022-07-14 MED ORDER — MIDAZOLAM HCL 2 MG/2ML IJ SOLN: 1.0000 mg | Freq: Once | INTRAMUSCULAR | Status: AC

## 2022-07-14 MED ORDER — FAMOTIDINE 20 MG PO TABS: 20.0000 mg | ORAL_TABLET | Freq: Once | ORAL | Status: AC

## 2022-07-14 MED ORDER — METOCLOPRAMIDE HCL 10 MG PO TABS: 5.0000 mg | ORAL_TABLET | Freq: Three times a day (TID) | ORAL | Status: DC | PRN

## 2022-07-14 MED ORDER — OXYCODONE HCL 5 MG PO TABS: 5.0000 mg | ORAL_TABLET | ORAL | 0 refills | Status: AC | PRN

## 2022-07-14 MED ORDER — FENTANYL CITRATE (PF) 100 MCG/2ML IJ SOLN
INTRAMUSCULAR | Status: AC
  Filled 2022-07-14: qty 2

## 2022-07-14 MED ORDER — CHLORHEXIDINE GLUCONATE 0.12 % MT SOLN
OROMUCOSAL | Status: AC
  Administered 2022-07-14: 15 mL via OROMUCOSAL
  Filled 2022-07-14: qty 15

## 2022-07-14 MED ORDER — ONDANSETRON HCL 4 MG/2ML IJ SOLN: 4.0000 mg | Freq: Four times a day (QID) | INTRAMUSCULAR | Status: DC | PRN

## 2022-07-14 MED ORDER — LIDOCAINE HCL (PF) 1 % IJ SOLN
INTRAMUSCULAR | Status: AC
  Filled 2022-07-14: qty 5

## 2022-07-14 MED ORDER — FAMOTIDINE 20 MG PO TABS
ORAL_TABLET | ORAL | Status: AC
  Administered 2022-07-14: 20 mg via ORAL
  Filled 2022-07-14: qty 1

## 2022-07-14 MED ORDER — OXYCODONE HCL 5 MG PO TABS: 5.0000 mg | ORAL_TABLET | Freq: Once | ORAL | Status: DC | PRN

## 2022-07-14 MED ORDER — ROCURONIUM BROMIDE 100 MG/10ML IV SOLN
INTRAVENOUS | Status: DC | PRN
  Administered 2022-07-14: 20 mg via INTRAVENOUS
  Administered 2022-07-14: 40 mg via INTRAVENOUS

## 2022-07-14 MED ORDER — BUPIVACAINE HCL (PF) 0.5 % IJ SOLN
INTRAMUSCULAR | Status: DC | PRN
  Administered 2022-07-14: 50 mg via PERINEURAL

## 2022-07-14 MED ORDER — TRIAMCINOLONE ACETONIDE 40 MG/ML IJ SUSP
INTRAMUSCULAR | Status: AC
  Filled 2022-07-14: qty 2

## 2022-07-14 MED ORDER — DEXAMETHASONE SODIUM PHOSPHATE 10 MG/ML IJ SOLN
INTRAMUSCULAR | Status: DC | PRN
  Administered 2022-07-14: 10 mg via INTRAVENOUS

## 2022-07-14 MED ORDER — KETOROLAC TROMETHAMINE 15 MG/ML IJ SOLN
15.0000 mg | Freq: Once | INTRAMUSCULAR | Status: AC
  Administered 2022-07-14: 15 mg via INTRAVENOUS

## 2022-07-14 MED ORDER — LIDOCAINE HCL (PF) 2 % IJ SOLN
INTRAMUSCULAR | Status: AC
  Filled 2022-07-14: qty 5

## 2022-07-14 MED ORDER — ROCURONIUM BROMIDE 10 MG/ML (PF) SYRINGE
PREFILLED_SYRINGE | INTRAVENOUS | Status: AC
  Filled 2022-07-14: qty 10

## 2022-07-14 MED ORDER — OXYCODONE HCL 5 MG PO TABS: 5.0000 mg | ORAL_TABLET | ORAL | Status: DC | PRN

## 2022-07-14 MED ORDER — 0.9 % SODIUM CHLORIDE (POUR BTL) OPTIME
TOPICAL | Status: DC | PRN
  Administered 2022-07-14: 250 mL

## 2022-07-14 MED ORDER — ACETAMINOPHEN 500 MG PO TABS
ORAL_TABLET | ORAL | Status: AC
  Administered 2022-07-14: 1000 mg
  Filled 2022-07-14: qty 2

## 2022-07-14 MED ORDER — BUPIVACAINE LIPOSOME 1.3 % IJ SUSP
INTRAMUSCULAR | Status: DC | PRN
  Administered 2022-07-14: 10 mL via PERINEURAL

## 2022-07-14 MED ORDER — BUPIVACAINE HCL (PF) 0.5 % IJ SOLN
INTRAMUSCULAR | Status: AC
  Filled 2022-07-14: qty 10

## 2022-07-14 MED ORDER — LIDOCAINE HCL (CARDIAC) PF 100 MG/5ML IV SOSY
PREFILLED_SYRINGE | INTRAVENOUS | Status: DC | PRN
  Administered 2022-07-14: 80 mg via INTRAVENOUS

## 2022-07-14 MED ORDER — ORAL CARE MOUTH RINSE: 15.0000 mL | Freq: Once | OROMUCOSAL | Status: AC

## 2022-07-14 MED ORDER — PHENYLEPHRINE HCL-NACL 20-0.9 MG/250ML-% IV SOLN
INTRAVENOUS | Status: DC | PRN
  Administered 2022-07-14: 35 ug/min via INTRAVENOUS

## 2022-07-14 MED ORDER — FENTANYL CITRATE (PF) 100 MCG/2ML IJ SOLN
INTRAMUSCULAR | Status: DC | PRN
  Administered 2022-07-14: 50 ug via INTRAVENOUS
  Administered 2022-07-14: 25 ug via INTRAVENOUS

## 2022-07-14 MED ORDER — TRANEXAMIC ACID 1000 MG/10ML IV SOLN
INTRAVENOUS | Status: AC
Start: 2022-07-14 — End: ?
  Filled 2022-07-14: qty 10

## 2022-07-14 MED ORDER — SODIUM CHLORIDE 0.9 % IR SOLN
Status: DC | PRN
  Administered 2022-07-14: 3000 mL

## 2022-07-14 MED ORDER — KETOROLAC TROMETHAMINE 15 MG/ML IJ SOLN: 7.5000 mg | Freq: Four times a day (QID) | INTRAMUSCULAR | Status: DC

## 2022-07-14 MED ORDER — BUPIVACAINE-EPINEPHRINE (PF) 0.5% -1:200000 IJ SOLN
INTRAMUSCULAR | Status: AC
  Filled 2022-07-14: qty 30

## 2022-07-14 MED ORDER — ONDANSETRON HCL 4 MG PO TABS: 4.0000 mg | ORAL_TABLET | Freq: Four times a day (QID) | ORAL | Status: DC | PRN

## 2022-07-14 MED ORDER — MIDAZOLAM HCL 2 MG/2ML IJ SOLN
INTRAMUSCULAR | Status: AC
  Administered 2022-07-14: 2 mg via INTRAVENOUS
  Filled 2022-07-14: qty 2

## 2022-07-14 MED ORDER — FENTANYL CITRATE (PF) 100 MCG/2ML IJ SOLN: 25.0000 ug | INTRAMUSCULAR | Status: DC | PRN

## 2022-07-14 MED ORDER — CEFAZOLIN SODIUM-DEXTROSE 2-4 GM/100ML-% IV SOLN
2.0000 g | Freq: Four times a day (QID) | INTRAVENOUS | Status: DC
  Administered 2022-07-14: 2 g via INTRAVENOUS

## 2022-07-14 MED ORDER — METOCLOPRAMIDE HCL 5 MG/ML IJ SOLN: 5.0000 mg | Freq: Three times a day (TID) | INTRAMUSCULAR | Status: DC | PRN

## 2022-07-14 MED ORDER — PHENYLEPHRINE 80 MCG/ML (10ML) SYRINGE FOR IV PUSH (FOR BLOOD PRESSURE SUPPORT)
PREFILLED_SYRINGE | INTRAVENOUS | Status: DC | PRN
  Administered 2022-07-14: 160 ug via INTRAVENOUS

## 2022-07-14 MED ORDER — CEFAZOLIN SODIUM-DEXTROSE 2-4 GM/100ML-% IV SOLN
2.0000 g | INTRAVENOUS | Status: AC
  Administered 2022-07-14: 2 g via INTRAVENOUS

## 2022-07-14 SURGICAL SUPPLY — 71 items
BASEPLATE BOSS DRILL (MISCELLANEOUS) IMPLANT
BIT DRILL 2.5 (BIT) ×1
BIT DRILL 2.5X4.5XSCR (BIT) IMPLANT
BIT DRILL F/BASEPLATE CENTRAL (BIT) IMPLANT
BIT DRL 2.5X4.5XSCR (BIT) ×1
BLADE SAW SAG 25X90X1.19 (BLADE) ×1 IMPLANT
CHLORAPREP W/TINT 26 (MISCELLANEOUS) ×1 IMPLANT
COOLER POLAR GLACIER W/PUMP (MISCELLANEOUS) ×1 IMPLANT
COVER BACK TABLE REUSABLE LG (DRAPES) ×1 IMPLANT
DRAPE 3/4 80X56 (DRAPES) ×1 IMPLANT
DRAPE INCISE IOBAN 66X45 STRL (DRAPES) ×1 IMPLANT
DRILL BASEPLATE CENTRAL  S (BIT) ×1
DRILL BASEPLATE CENTRAL S (BIT) ×1
DRSG OPSITE POSTOP 4X6 (GAUZE/BANDAGES/DRESSINGS) IMPLANT
DRSG OPSITE POSTOP 4X8 (GAUZE/BANDAGES/DRESSINGS) ×1 IMPLANT
ELECT BLADE 6.5 EXT (BLADE) IMPLANT
ELECT CAUTERY BLADE 6.4 (BLADE) ×1 IMPLANT
ELECT REM PT RETURN 9FT ADLT (ELECTROSURGICAL) ×1
ELECTRODE REM PT RTRN 9FT ADLT (ELECTROSURGICAL) ×1 IMPLANT
GAUZE XEROFORM 1X8 LF (GAUZE/BANDAGES/DRESSINGS) ×1 IMPLANT
GLENOSPHERE RSS 2 CONCENTRIC (Shoulder) IMPLANT
GLOVE BIO SURGEON STRL SZ7.5 (GLOVE) ×4 IMPLANT
GLOVE BIO SURGEON STRL SZ8 (GLOVE) ×4 IMPLANT
GLOVE BIOGEL PI IND STRL 8 (GLOVE) ×2 IMPLANT
GLOVE BIOGEL PI INDICATOR 8 (GLOVE) ×2
GLOVE SURG UNDER LTX SZ8 (GLOVE) ×1 IMPLANT
GOWN STRL REUS W/ TWL LRG LVL3 (GOWN DISPOSABLE) ×1 IMPLANT
GOWN STRL REUS W/ TWL XL LVL3 (GOWN DISPOSABLE) ×1 IMPLANT
GOWN STRL REUS W/TWL LRG LVL3 (GOWN DISPOSABLE) ×1
GOWN STRL REUS W/TWL XL LVL3 (GOWN DISPOSABLE) ×1
GUIDE PIN 2.0 X 150 (WIRE) IMPLANT
HOOD PEEL AWAY FLYTE STAYCOOL (MISCELLANEOUS) ×3 IMPLANT
IV NS IRRIG 3000ML ARTHROMATIC (IV SOLUTION) ×1 IMPLANT
KIT STABILIZATION SHOULDER (MISCELLANEOUS) ×1 IMPLANT
KIT TURNOVER KIT A (KITS) ×1 IMPLANT
LINER STD +3S RSS HXL (Liner) IMPLANT
MANIFOLD NEPTUNE II (INSTRUMENTS) ×1 IMPLANT
MASK FACE SPIDER DISP (MASK) ×1 IMPLANT
MAT ABSORB  FLUID 56X50 GRAY (MISCELLANEOUS) ×1
MAT ABSORB FLUID 56X50 GRAY (MISCELLANEOUS) ×1 IMPLANT
NDL MAYO CATGUT SZ1 (NEEDLE) IMPLANT
NDL SAFETY ECLIPSE 18X1.5 (NEEDLE) ×1 IMPLANT
NDL SPNL 20GX3.5 QUINCKE YW (NEEDLE) ×1 IMPLANT
NEEDLE HYPO 18GX1.5 SHARP (NEEDLE)
NEEDLE MAYO CATGUT SZ1 (NEEDLE) IMPLANT
NEEDLE SPNL 20GX3.5 QUINCKE YW (NEEDLE) IMPLANT
NS IRRIG 500ML POUR BTL (IV SOLUTION) ×1 IMPLANT
PACK ARTHROSCOPY SHOULDER (MISCELLANEOUS) ×1 IMPLANT
PAD ARMBOARD 7.5X6 YLW CONV (MISCELLANEOUS) ×1 IMPLANT
PAD WRAPON POLAR SHDR UNIV (MISCELLANEOUS) ×1 IMPLANT
PLATE BASE REVERSE RSS S (Plate) IMPLANT
PULSAVAC PLUS IRRIG FAN TIP (DISPOSABLE) ×1
SCREW 4.5X15 RSS W CAP (Screw) IMPLANT
SCREW 4.5X25 RSS W CAP (Screw) IMPLANT
SCREW BODY REVERSE STD (Screw) IMPLANT
SLING ULTRA II M (MISCELLANEOUS) IMPLANT
SPONGE T-LAP 18X18 ~~LOC~~+RFID (SPONGE) ×2 IMPLANT
STAPLER SKIN PROX 35W (STAPLE) ×1 IMPLANT
STEM PRESS FIT SZ 10 TSS (Stem) IMPLANT
SUT ETHIBOND 0 MO6 C/R (SUTURE) ×1 IMPLANT
SUT FIBERWIRE #2 38 BLUE 1/2 (SUTURE) ×4
SUT VIC AB 0 CT1 36 (SUTURE) ×1 IMPLANT
SUT VIC AB 2-0 CT1 27 (SUTURE) ×2
SUT VIC AB 2-0 CT1 TAPERPNT 27 (SUTURE) ×2 IMPLANT
SUTURE FIBERWR #2 38 BLUE 1/2 (SUTURE) ×4 IMPLANT
SYR 10ML LL (SYRINGE) ×1 IMPLANT
SYR 30ML LL (SYRINGE) ×1 IMPLANT
TIP FAN IRRIG PULSAVAC PLUS (DISPOSABLE) ×1 IMPLANT
TRAP FLUID SMOKE EVACUATOR (MISCELLANEOUS) ×1 IMPLANT
WATER STERILE IRR 500ML POUR (IV SOLUTION) ×1 IMPLANT
WRAPON POLAR PAD SHDR UNIV (MISCELLANEOUS) ×1

## 2022-07-14 NOTE — Anesthesia Procedure Notes (Signed)
Anesthesia Regional Block: Interscalene brachial plexus block   Pre-Anesthetic Checklist: , timeout performed,  Correct Patient, Correct Site, Correct Laterality,  Correct Procedure, Correct Position, site marked,  Risks and benefits discussed,  Surgical consent,  Pre-op evaluation,  At surgeon's request and post-op pain management  Laterality: Upper and Left  Prep: chloraprep       Needles:  Injection technique: Single-shot  Needle Type: Stimiplex     Needle Length: 9cm  Needle Gauge: 22     Additional Needles:   Procedures:,,,, ultrasound used (permanent image in chart),,    Narrative:  Start time: 07/14/2022 10:18 AM End time: 07/14/2022 10:20 AM Injection made incrementally with aspirations every 5 mL.  Performed by: Personally  Anesthesiologist: Iran Ouch, MD  Additional Notes: Patient consented for risk and benefits of nerve block including but not limited to nerve damage, failed block, bleeding and infection.  Patient voiced understanding.  Functioning IV was confirmed and monitors were applied.  Timeout done prior to procedure and prior to any sedation being given to the patient.  Patient confirmed procedure site prior to any sedation given to the patient. Sterile prep,hand hygiene and sterile gloves were used.  Minimal sedation used for procedure.  No paresthesia endorsed by patient during the procedure.  Negative aspiration and negative test dose prior to incremental administration of local anesthetic. The patient tolerated the procedure well with no immediate complications.

## 2022-07-14 NOTE — Evaluation (Signed)
Occupational Therapy Evaluation Patient Details Name: Joyce Mckinney MRN: [id number] DOB: December 06, 1945 Today's Date: 07/14/2022   History of Present Illness Joyce Mckinney is a 76 y.o. female who presents s/p Reverse left total shoulder arthroplasty with biceps tenodesis on 07/14/22.   Clinical Impression   Joyce Mckinney was seen for OT evaluation this date. Prior to hospital admission, pt was Independent for mobility and ADLs. Pt lives with daughter and granddaughter. Pt presents to acute OT demonstrating impaired ADL performance and functional mobility 2/2 decreased activity tolerance and functional strength/ROM/balance deficits. Pt currently requires MOD A don underwear and shorts. SUPERVISION for toielt t/f, pericare with lateral leans, and hand washing standing sink side. MAX A don/doff sling and polar care. Pt instructed in polar care mgt, compression stockings mgt, sling/immobilizer mgt, ROM exercises for LUE (with instructions for no shoulder exercises until full sensation has returned), LUE precautions, adaptive strategies for bathing/dressing/toileting/grooming, and positioning for sleep. All education complete, will sign off. Upon hospital discharge, recommend following surgeon recommendation.      Recommendations for follow up therapy are one component of a multi-disciplinary discharge planning process, led by the attending physician.  Recommendations may be updated based on patient status, additional functional criteria and insurance authorization.   Follow Up Recommendations  Follow physician's recommendations for discharge plan and follow up therapies    Assistance Recommended at Discharge Intermittent Supervision/Assistance  Patient can return home with the following A little help with walking and/or transfers;A lot of help with bathing/dressing/bathroom    Functional Status Assessment  Patient has had a recent decline in their functional status and demonstrates the ability to  make significant improvements in function in a reasonable and predictable amount of time.  Equipment Recommendations  BSC/3in1    Recommendations for Other Services       Precautions / Restrictions Precautions Precautions: Shoulder Shoulder Interventions: Shoulder sling/immobilizer;Shoulder abduction pillow;Off for dressing/bathing/exercises Precaution Booklet Issued: Yes (comment) Required Braces or Orthoses: Sling Restrictions Weight Bearing Restrictions: Yes LUE Weight Bearing: Non weight bearing      Mobility Bed Mobility               General bed mobility comments: not tested    Transfers Overall transfer level: Needs assistance Equipment used: None Transfers: Sit to/from Stand Sit to Stand: Supervision                  Balance Overall balance assessment: Mild deficits observed, not formally tested                                         ADL either performed or assessed with clinical judgement   ADL Overall ADL's : Needs assistance/impaired                                       General ADL Comments: MOD A don underwear and shorts. SUPERVISION for toielt t/f, pericare with lateral leans, and hand washing standing sink side. MAX A don/doff sling and polar care      Pertinent Vitals/Pain Pain Assessment Pain Assessment: No/denies pain     Hand Dominance Right   Extremity/Trunk Assessment Upper Extremity Assessment Upper Extremity Assessment: LUE deficits/detail LUE: Unable to fully assess due to immobilization   Lower Extremity Assessment Lower Extremity Assessment: Overall WFL for tasks assessed  Communication Communication Communication: No difficulties   Cognition Arousal/Alertness: Awake/alert Behavior During Therapy: WFL for tasks assessed/performed Overall Cognitive Status: Within Functional Limits for tasks assessed                                                   Home Living Family/patient expects to be discharged to:: Private residence Living Arrangements: Children Available Help at Discharge: Family;Available 24 hours/day Type of Home: House Home Access: Level entry     Home Layout: One level     Bathroom Shower/Tub: Tub/shower unit         Home Equipment: None          Prior Functioning/Environment Prior Level of Function : Independent/Modified Independent                        OT Problem List: Decreased strength;Decreased range of motion;Decreased activity tolerance;Impaired balance (sitting and/or standing);Decreased safety awareness         OT Goals(Current goals can be found in the care plan section) Acute Rehab OT Goals Patient Stated Goal: to go home OT Goal Formulation: With patient Time For Goal Achievement: 07/28/22 Potential to Achieve Goals: Good   AM-PAC OT "6 Clicks" Daily Activity     Outcome Measure Help from another person eating meals?: None Help from another person taking care of personal grooming?: None Help from another person toileting, which includes using toliet, bedpan, or urinal?: A Lot Help from another person bathing (including washing, rinsing, drying)?: A Lot Help from another person to put on and taking off regular upper body clothing?: A Lot Help from another person to put on and taking off regular lower body clothing?: A Little 6 Click Score: 17   End of Session    Activity Tolerance: Patient tolerated treatment well Patient left: in chair;with nursing/sitter in room;with family/visitor present  OT Visit Diagnosis: Other abnormalities of gait and mobility (R26.89);Muscle weakness (generalized) (M62.81)                Time: 3300-7622 OT Time Calculation (min): 34 min Charges:  OT General Charges $OT Visit: 1 Visit OT Evaluation $OT Eval Moderate Complexity: 1 Mod OT Treatments $Self Care/Home Management : 8-22 mins  Dessie Coma, M.S. OTR/L  07/14/22, 4:34 PM   ascom [phone]

## 2022-07-14 NOTE — Anesthesia Procedure Notes (Signed)
Procedure Name: Intubation Date/Time: 07/14/2022 11:30 AM  Performed by: Loletha Grayer, CRNAPre-anesthesia Checklist: Patient identified, Patient being monitored, Timeout performed, Emergency Drugs available and Suction available Patient Re-evaluated:Patient Re-evaluated prior to induction Oxygen Delivery Method: Circle system utilized Preoxygenation: Pre-oxygenation with 100% oxygen Induction Type: IV induction Ventilation: Mask ventilation without difficulty Laryngoscope Size: Miller and 2 Grade View: Grade I Tube type: Oral Tube size: 7.0 mm Number of attempts: 1 Airway Equipment and Method: Stylet Placement Confirmation: ETT inserted through vocal cords under direct vision, positive ETCO2 and breath sounds checked- equal and bilateral Secured at: 21 cm Tube secured with: Tape Dental Injury: Teeth and Oropharynx as per pre-operative assessment

## 2022-07-14 NOTE — Transfer of Care (Signed)
Immediate Anesthesia Transfer of Care Note  Patient: Joyce Mckinney  Procedure(s) Performed: REVERSE SHOULDER ARTHROPLASTY WITH BICEPS TENODESIS (Left: Shoulder)  Patient Location: PACU  Anesthesia Type:General  Level of Consciousness: awake  Airway & Oxygen Therapy: Patient Spontanous Breathing and Patient connected to face mask oxygen  Post-op Assessment: Report given to RN and Post -op Vital signs reviewed and stable  Post vital signs: Reviewed and stable  Last Vitals:  Vitals Value Taken Time  BP 147/70 07/14/22 1351  Temp    Pulse 87 07/14/22 1354  Resp 21 07/14/22 1354  SpO2 100 % 07/14/22 1354  Vitals shown include unvalidated device data.  Last Pain:  Vitals:   07/14/22 1020  TempSrc:   PainSc: 7          Complications: No notable events documented.

## 2022-07-14 NOTE — Op Note (Signed)
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  Post-Op Diagnosis:   Same.  Procedure:   Reverse left total shoulder arthroplasty with biceps tenodesis.  Surgeon:   Pascal Lux, MD  Assistant:   Cameron Proud, PA-C  Anesthesia:   General endotracheal with an interscalene block using Exparel placed preoperatively by the anesthesiologist.  Findings:   As above.  Complications:   None  EBL:   50 cc  Fluids:   600 cc crystalloid  UOP:   None  TT:   None  Drains:   None  Closure:   Staples  Implants:   All press-fit Integra system with a 10 mm stem, a standard metaphyseal body, a +3 mm humeral platform, a mini baseplate, and a 38 mm concentric +2 mm laterally offset glenosphere.  Brief Clinical Note:   The patient is a 76 year old female with a long history of progressive worsening left shoulder pain. Her symptoms have progressed despite medications, activity modification, etc. Her history examination consistent with advanced degenerative joint disease with rotator cuff tendinopathy and biceps tendinopathy, all of which were confirmed by preoperative MRI scanning. The patient presents at this time for a reverse left total shoulder arthroplasty with biceps tenodesis.  Procedure:   The patient underwent placement of an interscalene block using Exparel by the anesthesiologist in the preoperative holding area before being brought into the operating room and lain in the supine position. The patient then underwent general endotracheal intubation and anesthesia before the patient was repositioned in the beach chair position using the beach chair positioner. The left shoulder and upper extremity were prepped with ChloraPrep solution before being draped sterilely. Preoperative antibiotics were administered.   A timeout was performed to verify the  appropriate surgical site before a standard anterior approach to the shoulder was made through an approximately 4-5 inch incision. The incision was carried down through the subcutaneous tissues to expose the deltopectoral fascia. The interval between the deltoid and pectoralis muscles was identified and this plane developed, retracting the cephalic vein laterally with the deltoid muscle. The conjoined tendon was identified. Its lateral margin was dissected and the Kolbel self-retraining retractor inserted. The "three sisters" were identified and cauterized. Bursal tissues were removed to improve visualization.   The biceps tendon was identified near the inferior aspect of the bicipital groove. A soft tissue tenodesis was performed by attaching the biceps tendon to the adjacent pectoralis major tendon using two #0 Ethibond interrupted sutures. The biceps tendon was then transected just proximal to the tenodesis site. The subscapularis tendon was released from its attachment to the lesser tuberosity 1 cm proximal to its insertion and several tagging sutures placed. The inferior capsule was released with care after identifying and protecting the axillary nerve. The proximal humeral cut was made at approximately 20 of retroversion using the extra-medullary guide.   Attention was redirected to the glenoid. The labrum was debrided circumferentially before the center of the glenoid was identified. The guidewire was drilled into the glenoid neck using the appropriate guide. After verifying its position, it was overreamed with the mini-baseplate reamer to create a flat surface before the stem reamer was utilized. The superior and inferior peg sites were reamed using the appropriate guide to complete the glenoid preparation. The permanent mini-baseplate was impacted into place. It was stabilized with a 15 x 4.5 mm central screw and four peripheral  screws. Locking caps were placed over the superior and inferior screws.  The permanent 38 mm concentric glenosphere with +2 mm of lateral offset was then impacted into place and its Morse taper locking mechanism verified using manual distraction.  Attention was directed to the humeral side. The humeral canal was prepared utilizing the tapered stem reamers sequentially beginning with the 7 mm stem and progressing to a 10 mm stem. This demonstrated a good tight fit. The metaphyseal region was then prepared using the appropriate planar device. The trial stem and standard metaphyseal body were put together on the back table and a trial reduction performed using the +0 mm and +3 mm inserts. With the +3 mm insert, the arm demonstrated excellent range of motion as the hand could be brought across the chest to the opposite shoulder and brought to the top of the patient's head and to the patient's ear. The shoulder appeared stable throughout this range of motion. The joint was dislocated and the trial components removed. The permanent 10 mm stem with the standard body was impacted into place with care taken to maintain the appropriate version. A repeat trial reduction with the +3 mm insert again demonstrated excellent stability with the findings as described above. Therefore, the shoulder was re-dislocated and, after inserting the locking screw to secure the body to the stem, the permanent +3 mm insert was impacted into place. After verifying its locking mechanism, the shoulder was relocated using two finger pressure and again placed through a range of motion with the findings as described above.  The wound was copiously irrigated with sterile saline solution using the jet lavage system before a solution of 10 cc of Exparel and 30 cc of 0.5% Sensorcaine with epinephrine was injected into the pericapsular and peri-incisional tissues to help with postoperative analgesia. The subscapularis tendon was reapproximated using #2 FiberWire interrupted sutures. The deltopectoral interval was closed  using #0 Vicryl interrupted sutures before the subcutaneous tissues were closed using 2-0 Vicryl interrupted sutures. The skin was closed using staples. Prior to closing the skin, 1 g of transexemic acid in 10 cc of normal saline was injected intra-articularly to help with postoperative bleeding. A sterile occlusive dressing was applied to the wound before the arm was placed into a shoulder immobilizer with an abduction pillow. A Polar Care system also was applied to the shoulder. The patient was then transferred back to a hospital bed before being awakened, extubated, and returned to the recovery room in satisfactory condition after tolerating the procedure well.

## 2022-07-14 NOTE — Discharge Instructions (Addendum)
Orthopedic discharge instructions: May shower with intact OpSite dressing once nerve block has worn off (Saturday).  Apply ice frequently to shoulder or use Polar Care device. Take ibuprofen 600-800 mg TID with meals for 7-10 days, then as necessary. Take pain medication as prescribed when needed.  May supplement with ES Tylenol if necessary. Keep shoulder immobilizer on at all times except may remove for bathing purposes. Follow-up in 10-14 days or as scheduled.  AMBULATORY SURGERY  DISCHARGE INSTRUCTIONS   The drugs that you were given will stay in your system until tomorrow so for the next 24 hours you should not:  Drive an automobile Make any legal decisions Drink any alcoholic beverage   You may resume regular meals tomorrow.  Today it is better to start with liquids and gradually work up to solid foods.  You may eat anything you prefer, but it is better to start with liquids, then soup and crackers, and gradually work up to solid foods.   Please notify your doctor immediately if you have any unusual bleeding, trouble breathing, redness and pain at the surgery site, drainage, fever, or pain not relieved by medication.    Additional Instructions:        Please contact your physician with any problems or Same Day Surgery at [phone], Monday through Friday 6 am to 4 pm, or [place] at [facility] number at [phone].

## 2022-07-14 NOTE — Anesthesia Preprocedure Evaluation (Addendum)
Anesthesia Evaluation  Patient identified by MRN, date of birth, ID band Patient awake    Reviewed: Allergy & Precautions, NPO status , Patient's Chart, lab work & pertinent test results  Airway Mallampati: III  TM Distance: >3 FB Neck ROM: full    Dental  (+) Chipped   Pulmonary asthma ,    Pulmonary exam normal        Cardiovascular hypertension, Pt. on medications Normal cardiovascular exam  TTE 11/25/2021: EF >55%, mild LVH, mild BAE, triv AR/PR, mild MR/TR, G1DD  Myocardial perfusion imaging study performed on 11/28/2021 revealed a normal left ventricular systolic function with EF 63%.  There was normal myocardial thickening and wall motion.  No artifact is noted.  Left ventricular cavity size normal.  There was no evidence of stress-induced myocardial ischemia or arrhythmia; no scintigraphic evidence of scar. Study determined to be normal and low risk.   Neuro/Psych negative neurological ROS  negative psych ROS   GI/Hepatic Neg liver ROS, GERD  ,  Endo/Other  diabetes, Well Controlled, Type 2Multinodular goiter  Renal/GU Renal InsufficiencyRenal disease     Musculoskeletal   Abdominal   Peds  Hematology  (+) Blood dyscrasia, anemia ,   Anesthesia Other Findings Past Medical History: No date: Anemia No date: Arthritis No date: Asthma No date: Chest pain at rest No date: Chronic kidney disease, stage 3a (HCC) No date: Degenerative disc disease, cervical No date: Diastolic dysfunction     Comment:  a.) TTE 11/25/2021: EF >55%, mild LVH, mild BAE, triv               AR/PR, mild MR/TR, G1DD No date: Diverticulitis No date: GERD (gastroesophageal reflux disease) No date: Hyperlipidemia No date: Hyperparathyroidism, secondary ([place]) No date: Hypertension No date: IBD (inflammatory bowel disease) No date: Microscopic hematuria No date: Multinodular goiter No date: Obesity No date: Palpitation No date: Reflux  gastritis No date: Renal cyst, left No date: SOB (shortness of breath) No date: Tubular adenoma of colon No date: Type 2 diabetes mellitus without complication ([place])  Past Surgical History: No date: ANTERIOR CERVICAL DECOMP/DISCECTOMY FUSION     Comment:  x2 No date: APPENDECTOMY No date: BILATERAL SALPINGOOPHORECTOMY 04/10/2015: COLONOSCOPY 03/26/2022: COLONOSCOPY 01/04/2020: LUMBAR FUSION     Comment:  L3-5     Dr. Deetta Perla No date: SHOULDER ARTHROSCOPY W/ ROTATOR CUFF REPAIR; Left 03/06/2015: TOTAL HIP ARTHROPLASTY; Right 01/12/2017: TOTAL HIP ARTHROPLASTY; Left No date: TUBAL LIGATION No date: VAGINAL HYSTERECTOMY     Reproductive/Obstetrics negative OB ROS                            Anesthesia Physical Anesthesia Plan  ASA: 2  Anesthesia Plan: General ETT   Post-op Pain Management: Regional block*   Induction: Intravenous  PONV Risk Score and Plan: 3 and Ondansetron, Dexamethasone, Midazolam and Treatment may vary due to age or medical condition  Airway Management Planned: Oral ETT  Additional Equipment:   Intra-op Plan:   Post-operative Plan: Extubation in OR  Informed Consent: I have reviewed the patients History and Physical, chart, labs and discussed the procedure including the risks, benefits and alternatives for the proposed anesthesia with the patient or authorized representative who has indicated his/her understanding and acceptance.     Dental Advisory Given  Plan Discussed with: Anesthesiologist, CRNA and Surgeon  Anesthesia Plan Comments:       Anesthesia Quick Evaluation

## 2022-07-14 NOTE — H&P (Signed)
History of Present Illness:  Joyce Mckinney is a 76 y.o. female who presents today as a result of a referral from Rachelle Hora, PA-C, for left shoulder pain.   The patient's symptoms began over a year ago and developed without any specific cause or injury . The patient describes the symptoms as marked (major pain with significant limitations) and have the quality of being aching, constant, nagging, stabbing, tender, and throbbing. The pain is localized to the anterior shoulder and localized to the posterior shoulder. These symptoms are aggravated with normal daily activities, with sleeping, carrying heavy objects, at higher levels of activity, with overhead activity, reaching behind the back, and getting dressed. She has tried acetaminophen and non-steroidal anti-inflammatories (Advil) with temporary partial relief. She has tried rest with no significant benefit. She has tried steroid injections and physical therapy in the past with limited benefit. She is now approximately 7 years status post a rotator cuff repair and decompression of her left shoulder. She also notes interval numbness and paresthesias down her arm to her hand. She does have a history of neck pain and has undergone prior ACDF surgery. This complaint is not work related. She is a sports non-participant.  Shoulder Surgical History:  The patient has had a rotator cuff repair and a decompression on her left shoulder in the past.  PMH/PSH/Family History/Social History/Meds/Allergies:  I have reviewed past medical, surgical, social and family history, medications and allergies as documented in the EMR.  Current Outpatient Medications: ACCU-CHEK AVIVA PLUS TEST STRP test strip 4  aspirin 81 MG EC tablet Take 1 tablet (81 mg total) by mouth once daily  atorvastatin (LIPITOR) 40 MG tablet TAKE 1 TABLET ONE TIME DAILY 60 tablet 11  blood glucose diagnostic test strip 1 each (1 strip total) once daily Use as instructed.Accu chek 100 each 12   blood glucose meter kit as directed accu-chek meter 1 each 0  dilTIAZem (CARDIZEM CD) 240 MG CD capsule Take 1 capsule (240 mg total) by mouth once daily 30 capsule 11  gabapentin (NEURONTIN) 100 MG capsule Take 1 capsule (100 mg total) by mouth at bedtime 90 capsule 3  ibuprofen (MOTRIN) 200 MG tablet Take 600 mg by mouth every 6 (six) hours as needed for Pain  latanoprost (XALATAN) 0.005 % ophthalmic solution Place 1 drop into both eyes nightly 2.5 mL 11  losartan (COZAAR) 50 MG tablet Take 1 tablet (50 mg total) by mouth once daily 90 tablet 3  metFORMIN (GLUCOPHAGE-XR) 750 MG XR tablet TAKE 2 TABLETS EVERY DAY WITH DINNER 120 tablet 11  omeprazole (PRILOSEC) 20 MG DR capsule Take 1 capsule (20 mg total) by mouth once daily as needed   Allergies:  Morphine Itching   Past Medical History:  Anemia  Arthritis  Asthma without status asthmaticus  Degenerative disc disease, cervical  Diverticula, colon  Gastroesophageal reflux disease 01/03/2018  GERD (gastroesophageal reflux disease)  omprazole  Hyperlipidemia  Hypertension  IBD (inflammatory bowel disease)  Multinodular goiter (nontoxic)  PONV (postoperative nausea and vomiting)  Poor intravenous access  Renal cyst, left 10/04/2014  Thyroid disease  Tubular adenoma of colon 10/13/2007  Type 2 diabetes mellitus (CMS-HCC)   Past Surgical History:  ARTHROPLASTY HIP TOTAL Right 03/06/2015  Procedure: ARTHROPLASTY HIP TOTAL; Surgeon: Rhett Erin Fulling, MD; Location: [facility]; Service: Orthopedics; Laterality: Right;  COLONOSCOPY 04/10/2015  Procedure: COLORECTAL CANCER SCREENING; COLONOSCOPY ON INDIVIDUAL AT HIGH RISK; Surgeon: Cordelia Poche, MD; Location: [facility]; Service: Gastroenterology;;  ARTHROPLASTY HIP TOTAL Left 01/12/2017  Procedure: TOTAL HIP ARTHROPLASTY; Surgeon: Rhett Erin Fulling, MD; Location: [place]; Service: Orthopedics; Laterality: Left;  TRANSFORAMINAL LUMBAR INTERBODY FUSION MINIMALLY INVASIVE N/A  01/04/2020  Procedure: L3-5 FUSION AND DECOMPRESSION; Surgeon: Malen Gauze, MD; Location: [place]; Service: Neurosurgery; Laterality: N/A;  ARTHRODESIS POSTERIOR/POSTEROLATERAL W/POSTERIOR INTERBODY N/A 01/04/2020  Procedure: ARTHRODESIS, COMBINED POSTERIOR/POSTEROLATERAL TECHN W/ POSTERIOR INTERBODY TECHNIQUE, SINGLE INTERSPACE AND SEGMENT; EACH ADD INTERSPACE AND SEGMENT (LIST IN ADDITION TO CODE FOR PRIMARY PROCEDURE); Surgeon: Malen Gauze, MD; Location: [place]; Service: Neurosurgery INSTRUMENTATION POSTERIOR SPINE 7 TO 12 VERTEBRAL SEGMENTS N/A 01/04/2020  Procedure: POSTERIOR SEGMENTAL INSTRUMENTATION (EG, PEDICLE FIXATION, DUAL RODS WITH MULTIPLE HOOKS AND SUBLAMINAR WIRES); 3 TO 6 VERTEBRAL SEGMENTS (LIST IN ADDITION TO PRIMARY PROCEDURE); Surgeon: Malen Gauze, MD; Location: [place]; Service: Neurosurgery; Laterality: N/A;  INSERTION INTERBODY BIOMECHANICAL DEVICE W/ INSTRUMENTATION N/A 01/04/2020  Procedure: INSERTION INTERBODY BIOMECHANICAL DEVICE WITH INTEGRAL ANTERIOR INSTRUMENTATION, TO INTERVERTEBRAL DISC SPACE IN CONJUNCTION INTERBODY ARTHRODESIS, EACH INTERSPACE (LIST CODE FOR PRIMARY PROCEDURE); Surgeon: Malen Gauze, MD; Location: [facility]; Service: Neurosurgery  INTRAOPERATIVE FLUOROSCOPY N/A 01/04/2020  Procedure: Kathryne Hitch; Surgeon: Malen Gauze, MD; Location: [place]; Service: Neurosurgery; Laterality: N/A;  COLONOSCOPY N/A 03/26/2022  Procedure: COLORECTAL CANCER SCREENING; Surgeon: Bonnielee Haff, MD; Location: [place]; Service: Gastroenterology; Laterality: N/A;  APPENDECTOMY  ARTHROSCOPIC ROTATOR CUFF REPAIR Left  BILATERAL SALPINGOOPHORECTOMY  CERVICAL DISCECTOMY (ACDF x 2)  TUBAL LIGATION  VAGINAL HYSTERECTOMY   Family History:  Diabetes Mother  High blood pressure (Hypertension) Mother  Lymphoma Mother  Parkinsonism Mother  Glaucoma Mother  Leukemia Father  Lymphoma Sister  Diabetes Sister  Coronary  Artery Disease (Blocked arteries around heart) Brother  Diabetes type II Brother  Lung cancer Brother  Lung cancer Brother  Sleep apnea Brother  Depression Daughter  Reflux disease Daughter  Reflux disease Daughter  Diverticulosis Son  Coronary Artery Disease (Blocked arteries around heart) Paternal Grandmother  Heart disease Paternal Grandmother  Macular degeneration Neg Hx  Colon cancer Neg Hx  Colon polyps Neg Hx   Social History:   Socioeconomic History:  Marital status: Single  Tobacco Use  Smoking status: Never  Smokeless tobacco: Never  Tobacco comments:  Never Smoked  Vaping Use  Vaping Use: Never used  Substance and Sexual Activity  Alcohol use: Yes  Types: 2 Glasses of wine, 2 Standard drinks or equivalent per week  Comment: I may drink a glass of wine or wine cooler 2 a month  Drug use: No  Sexual activity: Not Currently  Partners: Male  Birth control/protection: Abstinence  Social History Narrative  SOCIAL HISTORY: Denies any tobacco history, occasional alcohol user. 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 Her recent  travel history included a 10 hour road trip back from [place] on  November 25, 2010. She lives in [place] with her daughter and grandchildren   Review of Systems:  A comprehensive 14 point ROS was performed, reviewed, and the pertinent orthopaedic findings are documented in the HPI.  Physical Exam:  Vitals:  06/29/22 0910  BP: 132/72  Weight: 70.5 kg (155 lb 6.4 oz)  Height: 157.5 cm ('5\' 2"' )  PainSc: 0-No pain  PainLoc: Shoulder   General/Constitutional: The patient appears to be well-nourished, well-developed, and in no acute distress. Neuro/Psych: Normal mood and affect, oriented to person, place and time. Eyes: Non-icteric. Pupils are equal, round, and reactive to light, and exhibit synchronous movement. ENT: Unremarkable. Lymphatic: No palpable adenopathy. Respiratory: Lungs clear to auscultation, Normal  chest excursion, No wheezes, and Non-labored breathing Cardiovascular: Regular rate and rhythm. No murmurs. and No edema, swelling or tenderness, except as noted in detailed exam. Integumentary: No impressive skin lesions present, except as noted in detailed exam. Musculoskeletal: Unremarkable, except as noted in detailed exam.  Left shoulder exam: SKIN: Well-healed arthroscopic portal sites, otherwise unremarkable SWELLING: none WARMTH: none LYMPH NODES: no adenopathy palpable CREPITUS: Moderate crepitance of glenohumeral joint TENDERNESS: Minimally tender along anterior shoulder ROM (active):  Forward flexion: 90 degrees Abduction: 80 degrees Internal rotation: Left buttock ROM (passive):  Forward flexion: 105 degrees Abduction: 95 degrees ER/IR at 90 abd: 45 degrees / 50 degrees  She describes moderate pain and crepitance with all motions.  STRENGTH: Forward flexion: 4-4+/5 Abduction: 4-4+/5 External rotation: 4-4+/5 Internal rotation: 4+/5 Pain with RC testing: Mild pain with resisted forward flexion and abduction.  STABILITY: Normal  SPECIAL TESTS: Luan Pulling' test: positive, mild Speed's test: Not evaluated Capsulitis - pain w/ passive ER: no Crossed arm test: Mildly positive Crank: Not evaluated Anterior apprehension: Negative Posterior apprehension: Not evaluated  She is neurovascularly intact to the left upper extremity.  Imaging:  Shoulder X-Ray Imaging: Recent AP, true AP, and Y-scapular views of the left shoulder are available for review and have been reviewed by myself. These films demonstrate significant degenerative changes with complete loss of the glenohumeral joint space. There is a moderate inferior humeral osteophyte noted as well. The subacromial space is mildly decreased. There is no subacromial or infra-clavicular spurring. She demonstrates a Type II acromion.  Left Shoulder Imaging, MRI: MRI Shoulder Cartilage: Full thickness humeral head cartilage  loss. Full thickness glenoid cartilage loss. MRI Shoulder Rotator Cuff: Partial thickness tear of the supraspinatus. No retraction. MRI Shoulder Labrum / Biceps: Biceps tendinopathy and degenerative labral tearing. MRI Shoulder Bone: Subchondral cysts of the humerus and glenoid.  Both the films and report were reviewed by myself and discussed with the patient.  Assessment:  1. Primary osteoarthritis of left shoulder.  2. Rotator cuff tendinitis, left.  3. Nontraumatic incomplete tear of left rotator cuff.   Plan:  The treatment options were discussed with the patient. In addition, patient educational materials were provided regarding the diagnosis and treatment options. The patient is quite frustrated by her symptoms and functional limitations, and is ready to consider more aggressive treatment options. Therefore, I have recommended a surgical procedure, specifically a reverse left total shoulder arthroplasty. The procedure was discussed with the patient, as were the potential risks (including bleeding, infection, nerve and/or blood vessel injury, persistent or recurrent pain, loosening and/or failure of the components, dislocation, leg length inequality, need for further surgery, blood clots, strokes, heart attacks and/or arhythmias, pneumonia, etc.) and benefits. The patient states his/her understanding and wishes to proceed. All of the patient's questions and concerns were answered. She can call any time with further concerns. She will follow up post-surgery, routine.   H&P reviewed and patient re-examined. No changes.

## 2022-07-15 ENCOUNTER — Encounter: Payer: Self-pay | Admitting: Surgery

## 2022-07-16 LAB — SURGICAL PATHOLOGY

## 2022-07-16 NOTE — Anesthesia Postprocedure Evaluation (Signed)
Anesthesia Post Note  Patient: Joyce Mckinney  Procedure(s) Performed: REVERSE SHOULDER ARTHROPLASTY WITH BICEPS TENODESIS (Left: Shoulder)  Patient location during evaluation: PACU Anesthesia Type: General Level of consciousness: awake and alert Pain management: pain level controlled Vital Signs Assessment: post-procedure vital signs reviewed and stable Respiratory status: spontaneous breathing, nonlabored ventilation and respiratory function stable Cardiovascular status: blood pressure returned to baseline and stable Postop Assessment: no apparent nausea or vomiting Anesthetic complications: no   No notable events documented.   Last Vitals:  Vitals:   07/14/22 1500 07/14/22 1516  BP: (!) 143/70 (!) 148/76  Pulse: 72 63  Resp: 18 16  Temp: 36.5 C 36.6 C  SpO2: 96% 100%    Last Pain:  Vitals:   07/14/22 1516  TempSrc: Temporal  PainSc: 0-No pain                 [place] Ouch

## 2023-03-16 ENCOUNTER — Other Ambulatory Visit: Payer: Self-pay | Admitting: "Endocrinology

## 2023-03-16 DIAGNOSIS — E213 Hyperparathyroidism, unspecified: Secondary | ICD-10-CM

## 2023-03-23 ENCOUNTER — Encounter
Admission: RE | Admit: 2023-03-23 | Discharge: 2023-03-23 | Disposition: A | Discharge status: Home / Self Care | Payer: Medicare HMO | Source: Ambulatory Visit | Attending: "Endocrinology | Admitting: "Endocrinology

## 2023-03-23 DIAGNOSIS — E213 Hyperparathyroidism, unspecified: Secondary | ICD-10-CM | POA: Diagnosis not present

## 2023-03-23 MED ORDER — TECHNETIUM TC 99M SESTAMIBI - CARDIOLITE
25.3900 | Freq: Once | INTRAVENOUS | Status: AC | PRN
Start: 2023-03-23 — End: 2023-03-23
  Administered 2023-03-23: 25.39 via INTRAVENOUS

## 2023-07-17 IMAGING — MG MM DIGITAL SCREENING BILAT W/ TOMO AND CAD
8 series · 8 of 24 positions shown · non-contrast
Comparison: None.

CLINICAL DATA: Screening.

EXAM:
DIGITAL SCREENING BILATERAL MAMMOGRAM WITH TOMOSYNTHESIS AND CAD
TECHNIQUE: Bilateral screening digital craniocaudal and mediolateral oblique
mammograms were obtained. Bilateral screening digital breast
tomosynthesis was performed. The images were evaluated with
computer-aided detection.

[L MLO synth-2D]
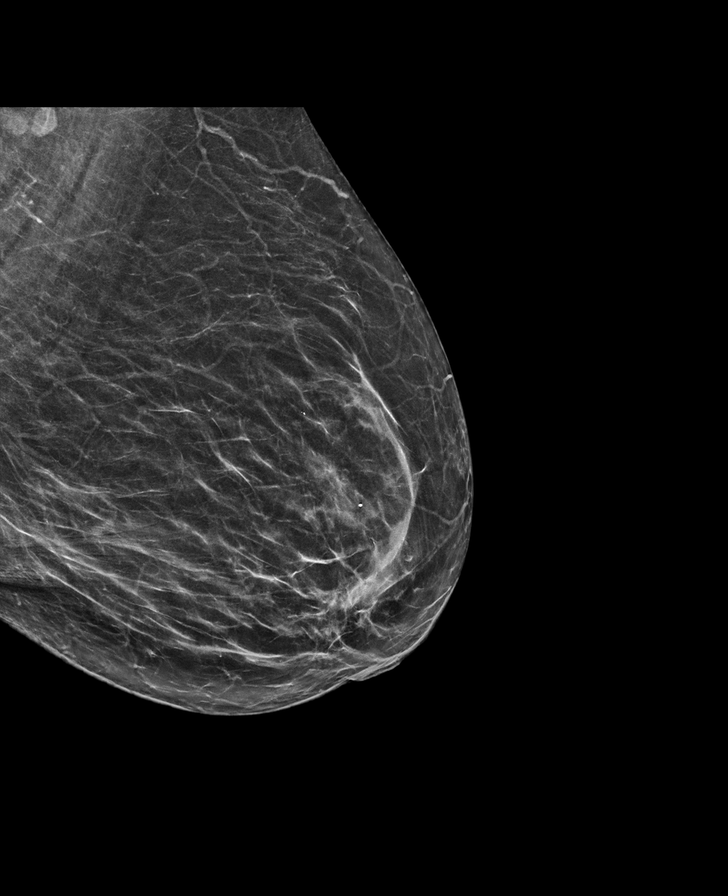

[L CC synth-2D]
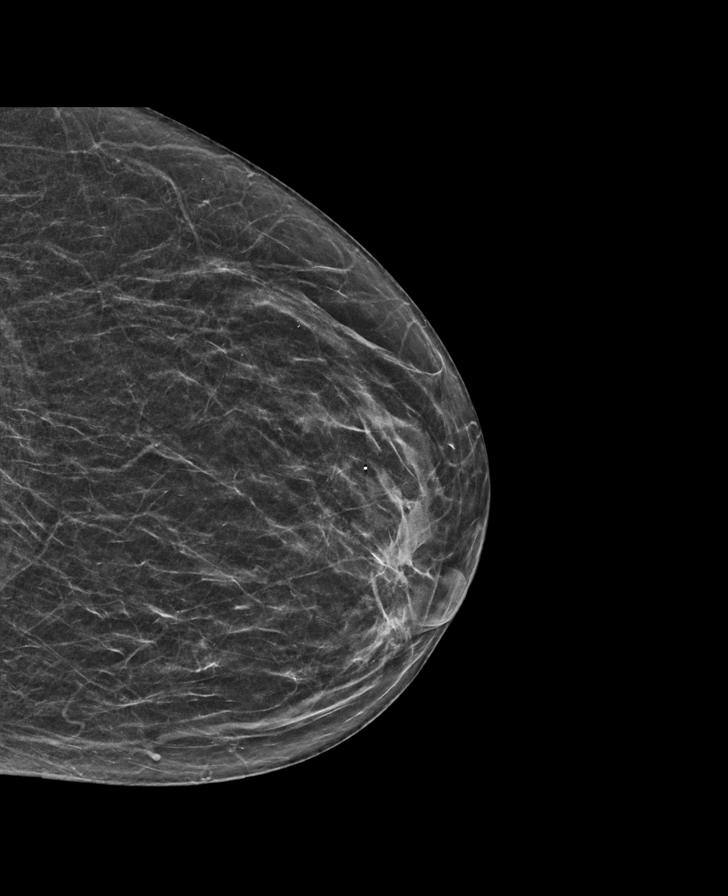

[R MLO synth-2D]
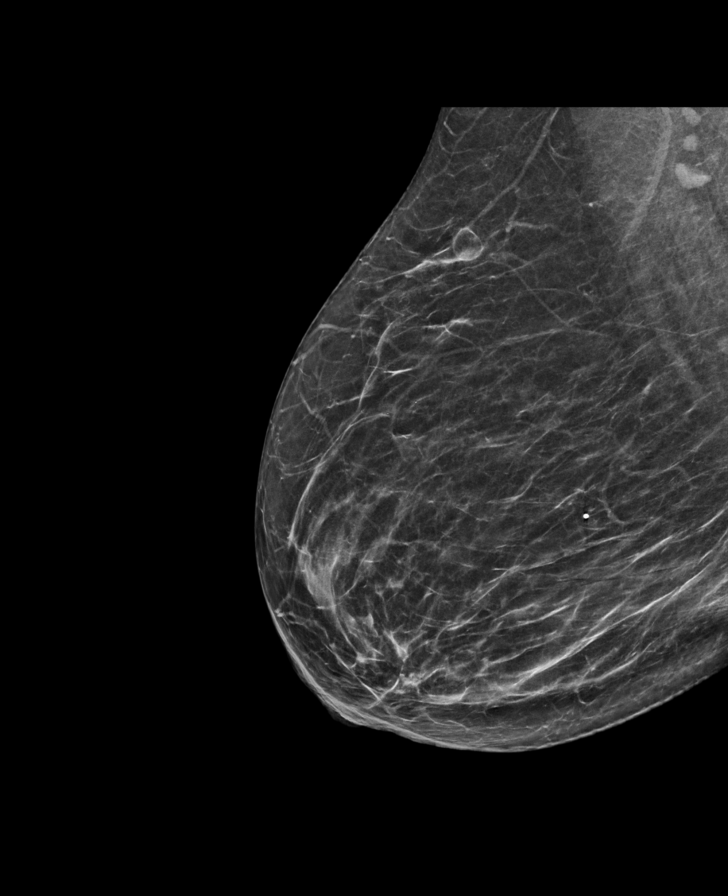

[R CC synth-2D]
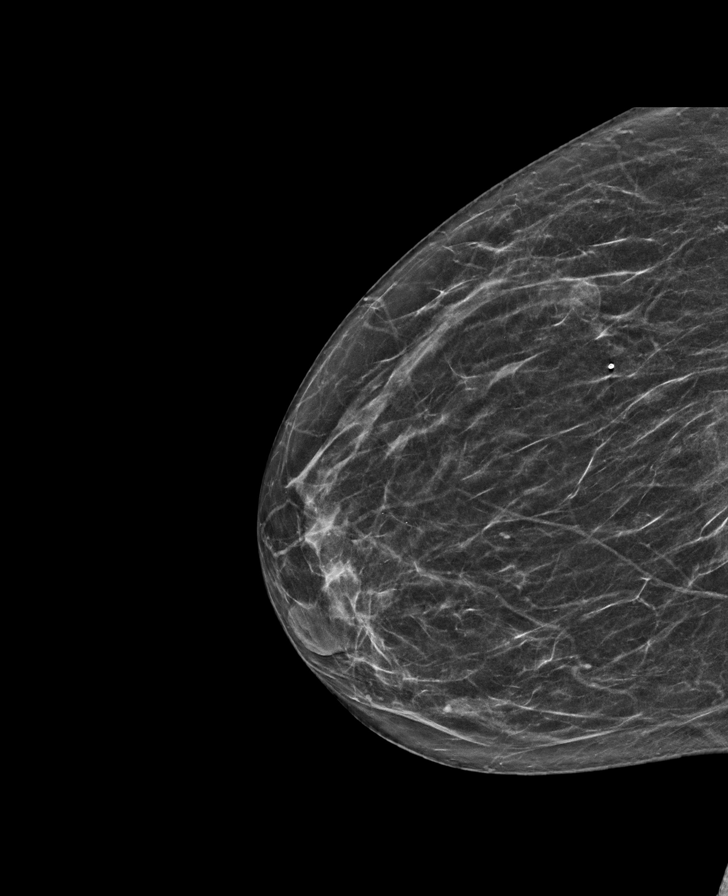

[R MLO tomo · tomo slice 31/61.0]
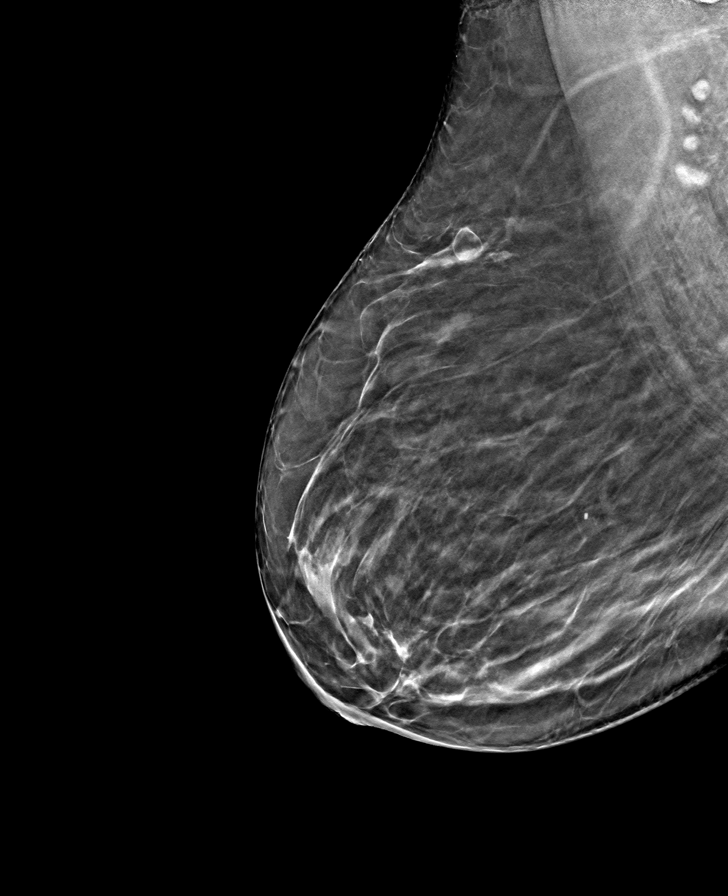

[R CC tomo · tomo slice 29/56.0]
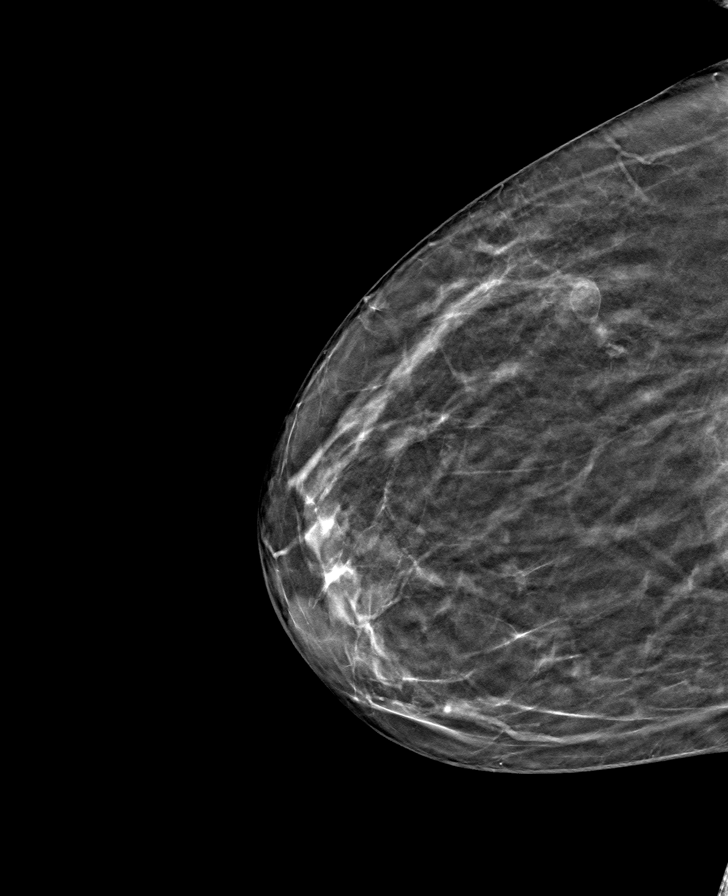

[L CC tomo · tomo slice 27/54.0]
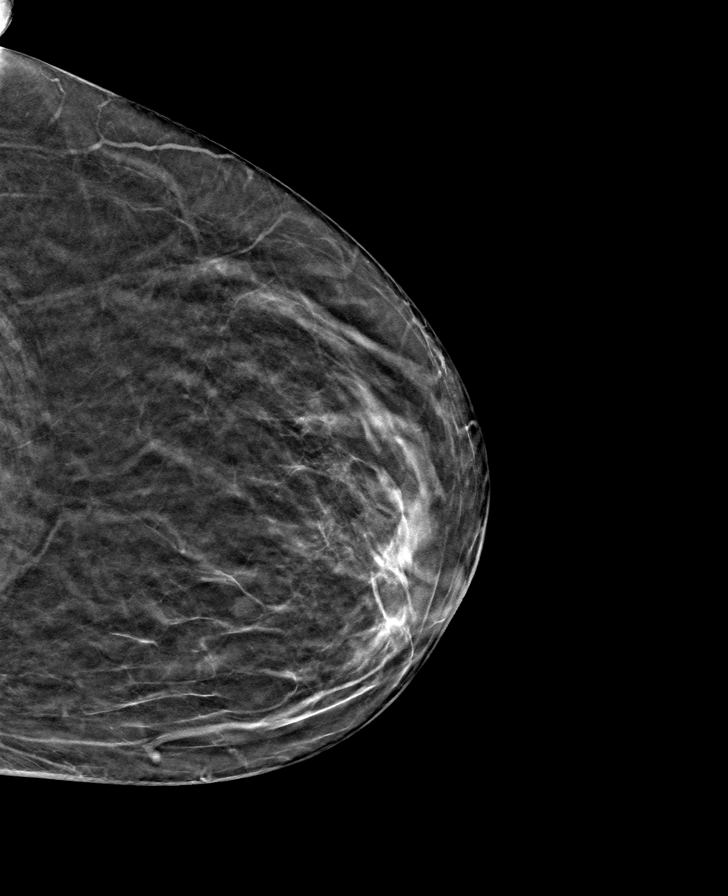

[L MLO tomo · tomo slice 32/63.0]
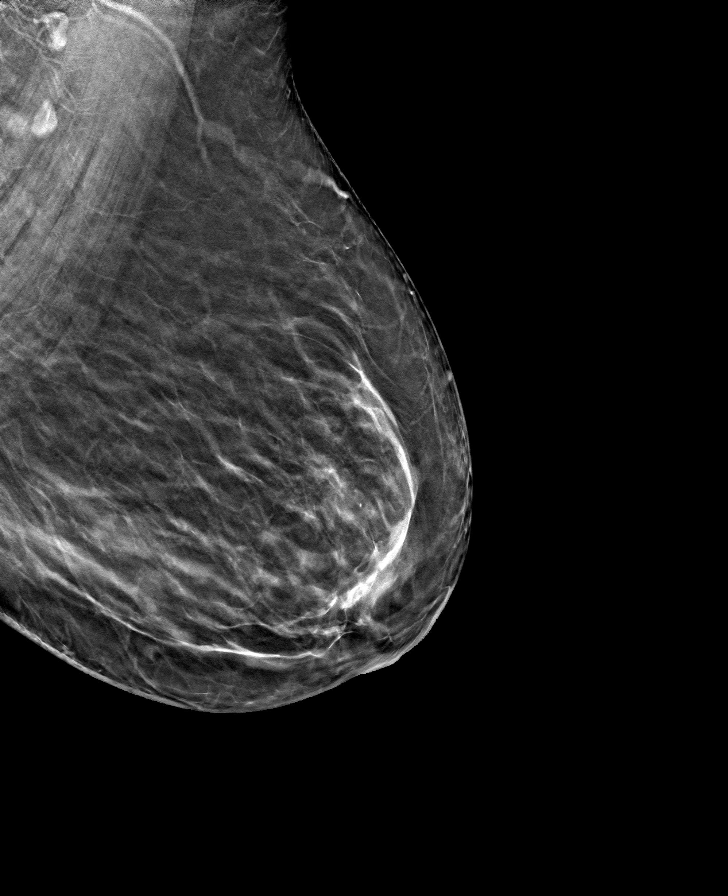

[8 of 24 positions shown; findings below may reference images not displayed]

ACR Breast Density Category b: There are scattered areas of
fibroglandular density.
FINDINGS: There are no findings suspicious for malignancy.
IMPRESSION: No mammographic evidence of malignancy. A result letter of this
screening mammogram will be mailed directly to the patient.

RECOMMENDATION:
Screening mammogram in one year. (Code:XG-X-X7B)

BI-RADS CATEGORY  1: Negative.
